# Patient Record
Sex: Female | Born: 1955 | Race: Black or African American | Hispanic: No | Marital: Single | Smoking: Never smoker
Health system: Southern US, Community
[De-identification: ages and names within clinical notes are randomized; demographics above are authoritative.]

## PROBLEM LIST (undated history)

## (undated) DIAGNOSIS — E785 Hyperlipidemia, unspecified: Secondary | ICD-10-CM

## (undated) DIAGNOSIS — H269 Unspecified cataract: Secondary | ICD-10-CM

## (undated) DIAGNOSIS — F329 Major depressive disorder, single episode, unspecified: Secondary | ICD-10-CM

## (undated) DIAGNOSIS — E119 Type 2 diabetes mellitus without complications: Secondary | ICD-10-CM

## (undated) DIAGNOSIS — I10 Essential (primary) hypertension: Secondary | ICD-10-CM

## (undated) DIAGNOSIS — J439 Emphysema, unspecified: Secondary | ICD-10-CM

## (undated) DIAGNOSIS — T7840XA Allergy, unspecified, initial encounter: Secondary | ICD-10-CM

## (undated) DIAGNOSIS — F32A Depression, unspecified: Secondary | ICD-10-CM

## (undated) DIAGNOSIS — K219 Gastro-esophageal reflux disease without esophagitis: Secondary | ICD-10-CM

## (undated) DIAGNOSIS — R011 Cardiac murmur, unspecified: Secondary | ICD-10-CM

## (undated) HISTORY — DX: Emphysema, unspecified: J43.9

## (undated) HISTORY — DX: Unspecified cataract: H26.9

## (undated) HISTORY — DX: Essential (primary) hypertension: I10

## (undated) HISTORY — DX: Major depressive disorder, single episode, unspecified: F32.9

## (undated) HISTORY — DX: Type 2 diabetes mellitus without complications: E11.9

## (undated) HISTORY — DX: Depression, unspecified: F32.A

## (undated) HISTORY — DX: Cardiac murmur, unspecified: R01.1

## (undated) HISTORY — PX: RADICAL HYSTERECTOMY: SHX2283

## (undated) HISTORY — PX: ABDOMINAL HYSTERECTOMY: SHX81

## (undated) HISTORY — DX: Hyperlipidemia, unspecified: E78.5

## (undated) HISTORY — DX: Gastro-esophageal reflux disease without esophagitis: K21.9

## (undated) HISTORY — DX: Allergy, unspecified, initial encounter: T78.40XA

---

## 2006-08-25 ENCOUNTER — Encounter: Admission: RE | Admit: 2006-08-25 | Discharge: 2006-08-25 | Payer: Self-pay | Admitting: *Deleted

## 2012-01-19 ENCOUNTER — Other Ambulatory Visit: Payer: Self-pay | Admitting: *Deleted

## 2012-01-19 ENCOUNTER — Ambulatory Visit
Admission: RE | Admit: 2012-01-19 | Discharge: 2012-01-19 | Disposition: A | Discharge status: Home / Self Care | Payer: PRIVATE HEALTH INSURANCE | Source: Ambulatory Visit | Attending: *Deleted | Admitting: *Deleted

## 2012-01-19 DIAGNOSIS — Z1231 Encounter for screening mammogram for malignant neoplasm of breast: Secondary | ICD-10-CM

## 2012-12-31 ENCOUNTER — Other Ambulatory Visit: Payer: Self-pay

## 2012-12-31 DIAGNOSIS — Z1231 Encounter for screening mammogram for malignant neoplasm of breast: Secondary | ICD-10-CM

## 2013-01-31 ENCOUNTER — Ambulatory Visit
Admission: RE | Admit: 2013-01-31 | Discharge: 2013-01-31 | Disposition: A | Discharge status: Home / Self Care | Payer: PRIVATE HEALTH INSURANCE | Source: Ambulatory Visit

## 2013-01-31 DIAGNOSIS — Z1231 Encounter for screening mammogram for malignant neoplasm of breast: Secondary | ICD-10-CM

## 2013-12-27 ENCOUNTER — Other Ambulatory Visit: Payer: Self-pay

## 2013-12-27 DIAGNOSIS — Z1231 Encounter for screening mammogram for malignant neoplasm of breast: Secondary | ICD-10-CM

## 2014-02-01 ENCOUNTER — Other Ambulatory Visit: Payer: Self-pay

## 2014-02-01 ENCOUNTER — Ambulatory Visit
Admission: RE | Admit: 2014-02-01 | Discharge: 2014-02-01 | Disposition: A | Discharge status: Home / Self Care | Payer: PRIVATE HEALTH INSURANCE | Source: Ambulatory Visit

## 2014-02-01 ENCOUNTER — Ambulatory Visit: Payer: PRIVATE HEALTH INSURANCE

## 2014-02-01 DIAGNOSIS — Z1231 Encounter for screening mammogram for malignant neoplasm of breast: Secondary | ICD-10-CM

## 2015-02-27 ENCOUNTER — Other Ambulatory Visit: Payer: Self-pay

## 2015-02-27 DIAGNOSIS — Z1231 Encounter for screening mammogram for malignant neoplasm of breast: Secondary | ICD-10-CM

## 2015-04-09 ENCOUNTER — Ambulatory Visit
Admission: RE | Admit: 2015-04-09 | Discharge: 2015-04-09 | Disposition: A | Discharge status: Home / Self Care | Payer: Medicare Other | Source: Ambulatory Visit

## 2015-04-09 DIAGNOSIS — Z1231 Encounter for screening mammogram for malignant neoplasm of breast: Secondary | ICD-10-CM

## 2017-01-28 ENCOUNTER — Other Ambulatory Visit: Payer: Self-pay | Admitting: Physician Assistant

## 2017-02-19 ENCOUNTER — Ambulatory Visit (INDEPENDENT_AMBULATORY_CARE_PROVIDER_SITE_OTHER): Payer: Medicare Other | Admitting: Pediatrics

## 2017-02-19 ENCOUNTER — Encounter: Payer: Self-pay | Admitting: Pediatrics

## 2017-02-19 VITALS — BP 130/71 | HR 84 | Temp 98.3°F | Ht <= 58 in | Wt 143.2 lb

## 2017-02-19 DIAGNOSIS — R6250 Unspecified lack of expected normal physiological development in childhood: Secondary | ICD-10-CM

## 2017-02-19 DIAGNOSIS — Z1211 Encounter for screening for malignant neoplasm of colon: Secondary | ICD-10-CM | POA: Diagnosis not present

## 2017-02-19 DIAGNOSIS — E119 Type 2 diabetes mellitus without complications: Secondary | ICD-10-CM | POA: Diagnosis not present

## 2017-02-19 DIAGNOSIS — Z1239 Encounter for other screening for malignant neoplasm of breast: Secondary | ICD-10-CM

## 2017-02-19 DIAGNOSIS — E039 Hypothyroidism, unspecified: Secondary | ICD-10-CM

## 2017-02-19 DIAGNOSIS — Z1231 Encounter for screening mammogram for malignant neoplasm of breast: Secondary | ICD-10-CM | POA: Diagnosis not present

## 2017-02-19 DIAGNOSIS — E785 Hyperlipidemia, unspecified: Secondary | ICD-10-CM

## 2017-02-19 DIAGNOSIS — F840 Autistic disorder: Secondary | ICD-10-CM

## 2017-02-19 LAB — BAYER DCA HB A1C WAIVED: HB A1C: 6.9 % (ref <7.0)

## 2017-02-19 MED ORDER — PRAVASTATIN SODIUM 40 MG PO TABS
40.0000 mg | ORAL_TABLET | Freq: Every day | ORAL | 3 refills | Status: DC
Start: 2017-02-19 — End: 2017-12-08

## 2017-02-19 NOTE — Progress Notes (Signed)
Subjective:   Patient ID: Shelley Wood, female    DOB: 30-Sep-1955, 61 y.o.   MRN: [id number] CC: New Patient (Initial Visit) med prob f/u HPI: Shelley Wood is a 61 y.o. female presenting for New Patient (Initial Visit)  Previoulsy followed by [facility] Here today with cousin Shelley Wood describes her as special needs, has autism, dev delay Likes schedules Family does her pill box each week Lives with her sister Shelley Wood, sister is her guardian  Is afraid of water, has help getting in and out of tub Needs help with buttons, zippers, certain shirts Sometimes incontinent, not often Doesn't like loud noises, sometimes that's when she has incontinence Can make sandwiches, uses microwave Doesn't wander Someone with her 24h a day  Has an imaginary "BF" Shelley Wood who no one else can see, has been around forever per cousin, sometimes pt will yell at [name] when she is upset but never violent Sometimes cousin thinks she uses Shelley Wood to help communicate with her family, and outlet when she cant get her way Never sees anything  Cousin has no concerns for her safety  Was followed by psychiatry in the past to get disability  HLD: taking simvastatin every other day  Diabetes: diagnosed several years ago BGLs 101-up to 250  No h/o MI or CVA  Had a hysterectomy for vaginal bleeding several years ago Due for mammogram  Did FOBT last year   Past Medical History:  Diagnosis Date  . Allergy   . Cataract   . Depression   . Diabetes mellitus without complication ([place])   . Emphysema of lung ([place])   . GERD (gastroesophageal reflux disease)   . Heart murmur   . Hyperlipidemia   . Hypertension    Family History  Problem Relation Age of Onset  . Arthritis Mother   . Heart disease Mother   . Hypertension Mother   . Stroke Mother   . Alcohol abuse Father   . Arthritis Father   . Early death Father   . Heart disease Father   . Arthritis Sister   . Birth defects  Sister   . Cancer Sister   . COPD Sister   . Hyperlipidemia Sister   . Hypertension Sister   . Arthritis Brother   . Hyperlipidemia Brother   . Hypertension Brother   . Arthritis Maternal Grandmother   . Arthritis Maternal Grandfather   . Arthritis Brother   . Arthritis Sister   . Cancer Sister   . Arthritis Sister   . Learning disabilities Sister   . Mental retardation Sister   . Arthritis Sister   . Arthritis Sister    Social History   Social History  . Marital status: Single    Spouse name: N/A  . Number of children: N/A  . Years of education: N/A   Social History Main Topics  . Smoking status: Never Smoker  . Smokeless tobacco: Never Used  . Alcohol use No  . Drug use: No  . Sexual activity: No   Other Topics Concern  . None   Social History Narrative  . None   ROS: All systems negative other than what is in HPI  Objective:    BP 130/71   Pulse 84   Temp 98.3 F (36.8 C) (Oral)   Ht '4\' 7"'$  (1.397 m)   Wt 143 lb 3.2 oz (65 kg)   BMI 33.28 kg/m   Wt Readings from Last 3 Encounters:  02/19/17 143 lb 3.2 oz (65  kg)    Gen: NAD, alert, cooperative with exam, NCAT EYES: EOMI, no conjunctival injection, or no icterus ENT:  TMs pearly gray b/l, OP without erythema LYMPH: no cervical LAD CV: NRRR, normal S1/S2, no murmur, distal pulses 2+ b/l Resp: CTABL, no wheezes, normal WOB Abd: +BS, soft, NTND. no guarding or organomegaly Ext: No edema, warm Neuro: Alert and oriented to self, moves all 4 legs equally MSK: normal muscle bulk  Assessment & Plan:  Shelley Wood was seen today for new patient (initial visit).  Diagnoses and all orders for this visit:  Hyperlipidemia, unspecified hyperlipidemia type switch to pravastatin, some sx with simvastatin, check labs -     pravastatin (PRAVACHOL) 40 MG tablet; Take 1 tablet (40 mg total) by mouth daily. -     Lipid panel  Hypothyroidism, unspecified type -     TSH  Screen for colon cancer Due for  screening -     Fecal occult blood, imunochemical; Future  Screening for breast cancer Due for screening -     MM Digital Screening; Future  Type 2 diabetes mellitus without complication, without long-term current use of insulin (HCC) A1c 6.9 today, adequate contorl, cont current PO meds -     CMP14+EGFR -     Bayer DCA Hb A1c Waived  Follow up plan: Return in about 3 months (around 05/22/2017) for med follow up. [name], MD [place]

## 2017-02-20 LAB — CMP14+EGFR
ALBUMIN: 4.6 g/dL (ref 3.5–5.5)
ALT: 17 IU/L (ref 0–32)
AST: 14 IU/L (ref 0–40)
Albumin/Globulin Ratio: 1.6 (ref 1.2–2.2)
Alkaline Phosphatase: 66 IU/L (ref 39–117)
BUN / CREAT RATIO: 15 (ref 9–23)
BUN: 11 mg/dL (ref 6–24)
Bilirubin Total: <0.2 mg/dL (ref 0.0–1.2)
CALCIUM: 10.8 mg/dL — AB (ref 8.7–10.2)
CO2: 24 mmol/L (ref 20–29)
CREATININE: 0.73 mg/dL (ref 0.57–1.00)
Chloride: 99 mmol/L (ref 96–106)
GFR, EST AFRICAN AMERICAN: 104 mL/min/{1.73_m2} (ref >59)
GFR, EST NON AFRICAN AMERICAN: 90 mL/min/{1.73_m2} (ref >59)
GLOBULIN, TOTAL: 2.9 g/dL (ref 1.5–4.5)
Glucose: 118 mg/dL — ABNORMAL HIGH (ref 65–99)
Potassium: 4.9 mmol/L (ref 3.5–5.2)
SODIUM: 140 mmol/L (ref 134–144)
TOTAL PROTEIN: 7.5 g/dL (ref 6.0–8.5)

## 2017-02-20 LAB — LIPID PANEL
CHOL/HDL RATIO: 3.4 ratio (ref 0.0–4.4)
Cholesterol, Total: 146 mg/dL (ref 100–199)
HDL: 43 mg/dL (ref >39)
LDL CALC: 88 mg/dL (ref 0–99)
Triglycerides: 75 mg/dL (ref 0–149)
VLDL CHOLESTEROL CAL: 15 mg/dL (ref 5–40)

## 2017-02-20 LAB — TSH: TSH: 1.86 u[IU]/mL (ref 0.450–4.500)

## 2017-02-21 DIAGNOSIS — E119 Type 2 diabetes mellitus without complications: Secondary | ICD-10-CM | POA: Insufficient documentation

## 2017-02-21 DIAGNOSIS — Z1239 Encounter for other screening for malignant neoplasm of breast: Secondary | ICD-10-CM | POA: Insufficient documentation

## 2017-02-21 DIAGNOSIS — Z1211 Encounter for screening for malignant neoplasm of colon: Secondary | ICD-10-CM | POA: Insufficient documentation

## 2017-02-21 DIAGNOSIS — E785 Hyperlipidemia, unspecified: Secondary | ICD-10-CM | POA: Insufficient documentation

## 2017-02-21 DIAGNOSIS — F840 Autistic disorder: Secondary | ICD-10-CM | POA: Insufficient documentation

## 2017-02-21 DIAGNOSIS — R6250 Unspecified lack of expected normal physiological development in childhood: Secondary | ICD-10-CM | POA: Insufficient documentation

## 2017-03-09 ENCOUNTER — Ambulatory Visit: Payer: Medicare Other

## 2017-05-25 ENCOUNTER — Ambulatory Visit: Payer: Medicare Other | Admitting: Pediatrics

## 2017-05-27 ENCOUNTER — Encounter: Payer: Self-pay | Admitting: Pediatrics

## 2017-06-09 ENCOUNTER — Ambulatory Visit (INDEPENDENT_AMBULATORY_CARE_PROVIDER_SITE_OTHER): Payer: Medicare Other | Admitting: Physician Assistant

## 2017-06-09 ENCOUNTER — Encounter: Payer: Self-pay | Admitting: Physician Assistant

## 2017-06-09 VITALS — BP 137/74 | HR 84 | Temp 97.7°F | Ht <= 58 in | Wt 144.6 lb

## 2017-06-09 DIAGNOSIS — Z23 Encounter for immunization: Secondary | ICD-10-CM | POA: Diagnosis not present

## 2017-06-09 DIAGNOSIS — E119 Type 2 diabetes mellitus without complications: Secondary | ICD-10-CM

## 2017-06-09 DIAGNOSIS — E1159 Type 2 diabetes mellitus with other circulatory complications: Secondary | ICD-10-CM

## 2017-06-09 DIAGNOSIS — E1169 Type 2 diabetes mellitus with other specified complication: Secondary | ICD-10-CM | POA: Diagnosis not present

## 2017-06-09 DIAGNOSIS — I10 Essential (primary) hypertension: Secondary | ICD-10-CM | POA: Diagnosis not present

## 2017-06-09 DIAGNOSIS — E785 Hyperlipidemia, unspecified: Secondary | ICD-10-CM

## 2017-06-09 LAB — BAYER DCA HB A1C WAIVED: HB A1C (BAYER DCA - WAIVED): 6.9 % (ref <7.0)

## 2017-06-09 MED ORDER — OMEPRAZOLE 20 MG PO CPDR: 20.0000 mg | DELAYED_RELEASE_CAPSULE | Freq: Every day | ORAL | 3 refills | Status: DC

## 2017-06-09 MED ORDER — JANUVIA 100 MG PO TABS: 100.0000 mg | ORAL_TABLET | Freq: Every day | ORAL | 3 refills | Status: DC

## 2017-06-09 MED ORDER — METFORMIN HCL 500 MG PO TABS: 500.0000 mg | ORAL_TABLET | Freq: Every day | ORAL | 3 refills | Status: DC

## 2017-06-09 MED ORDER — OLMESARTAN MEDOXOMIL 40 MG PO TABS: 40.0000 mg | ORAL_TABLET | Freq: Every day | ORAL | 3 refills | Status: DC

## 2017-06-09 NOTE — Patient Instructions (Signed)
In a few days you may receive a survey in the mail or online from Press Ganey regarding your visit with us today. Please take a moment to fill this out. Your feedback is very important to our whole office. It can help us better understand your needs as well as improve your experience and satisfaction. Thank you for taking your time to complete it. We care about you.  Riad Wagley, PA-C  

## 2017-06-09 NOTE — Progress Notes (Signed)
BP 137/74   Pulse 84   Temp 97.7 F (36.5 C) (Oral)   Ht _0  (1.397 m)   Wt 144 lb 9.6 oz (65.6 kg)   BMI 33.61 kg/m    Subjective:    Patient ID: Shelley Wood, female    DOB: 1955-11-21, 61 y.o.   MRN: [id number]  HPI: Shelley Wood is a 61 y.o. female presenting on 06/09/2017 for Follow-up (3 month-switching from [place])    Relevant past medical, surgical, family and social history reviewed and updated as indicated. Allergies and medications reviewed and updated.  Past Medical History:  Diagnosis Date  . Allergy   . Cataract   . Depression   . Diabetes mellitus without complication ([place])   . Emphysema of lung ([place])   . GERD (gastroesophageal reflux disease)   . Heart murmur   . Hyperlipidemia   . Hypertension     History reviewed. No pertinent surgical history.  Review of Systems  Constitutional: Negative.  Negative for activity change, fatigue and fever.  HENT: Negative.   Eyes: Negative.   Respiratory: Negative.  Negative for cough.   Cardiovascular: Negative.  Negative for chest pain.  Gastrointestinal: Negative.  Negative for abdominal pain.  Endocrine: Negative.   Genitourinary: Negative.  Negative for dysuria.  Musculoskeletal: Negative.   Skin: Negative.   Neurological: Negative.     Allergies as of 06/09/2017   No Known Allergies     Medication List        Accurate as of 06/09/17 10:28 PM. Always use your most recent med list.          JANUVIA 100 MG tablet Generic drug:  sitaGLIPtin Take 1 tablet (100 mg total) by mouth daily.   metFORMIN 500 MG tablet Commonly known as:  GLUCOPHAGE Take 1 tablet (500 mg total) by mouth daily with breakfast.   olmesartan 40 MG tablet Commonly known as:  BENICAR Take 1 tablet (40 mg total) by mouth daily.   omeprazole 20 MG capsule Commonly known as:  PRILOSEC Take 1 capsule (20 mg total) by mouth daily.   ONE TOUCH ULTRA 2 w/Device Kit   ONE TOUCH ULTRA TEST test strip Generic drug:   glucose blood   ONETOUCH DELICA LANCETS 33A Misc   pravastatin 40 MG tablet Commonly known as:  PRAVACHOL Take 1 tablet (40 mg total) by mouth daily.          Objective:    BP 137/74   Pulse 84   Temp 97.7 F (36.5 C) (Oral)   Ht _1  (1.397 m)   Wt 144 lb 9.6 oz (65.6 kg)   BMI 33.61 kg/m   No Known Allergies  Physical Exam  Constitutional: She is oriented to person, place, and time. She appears well-developed and well-nourished.  HENT:  Head: Normocephalic and atraumatic.  Eyes: Conjunctivae and EOM are normal. Pupils are equal, round, and reactive to light.  Cardiovascular: Normal rate, regular rhythm, normal heart sounds and intact distal pulses.  Pulmonary/Chest: Effort normal and breath sounds normal.  Abdominal: Soft. Bowel sounds are normal.  Neurological: She is alert and oriented to person, place, and time. She has normal reflexes.  Skin: Skin is warm and dry. No rash noted.  Psychiatric: She has a normal mood and affect. Her behavior is normal. Judgment and thought content normal.    Results for orders placed or performed in visit on 06/09/17  Bayer DCA Hb A1c Waived  Result Value Ref Range   Bayer [facility]  Hb A1c Waived 6.9 <7.0 %      Assessment & Plan:   1. Type 2 diabetes mellitus without complication, without long-term current use of insulin (HCC) - metFORMIN (GLUCOPHAGE) 500 MG tablet; Take 1 tablet (500 mg total) by mouth daily with breakfast.  Dispense: 90 tablet; Refill: 3 - JANUVIA 100 MG tablet; Take 1 tablet (100 mg total) by mouth daily.  Dispense: 90 tablet; Refill: 3 - Bayer DCA Hb A1c Waived  2. Hypertension associated with diabetes ([place]) - olmesartan (BENICAR) 40 MG tablet; Take 1 tablet (40 mg total) by mouth daily.  Dispense: 90 tablet; Refill: 3  3. Hyperlipidemia associated with type 2 diabetes mellitus ([place])  4. Hyperlipidemia, unspecified hyperlipidemia type  5. Need for immunization against influenza - Flu Vaccine QUAD 36+ mos  IM    Current Outpatient Medications:  .  Blood Glucose Monitoring Suppl (ONE TOUCH ULTRA 2) w/Device KIT, , Disp: , Rfl:  .  JANUVIA 100 MG tablet, Take 1 tablet (100 mg total) by mouth daily., Disp: 90 tablet, Rfl: 3 .  metFORMIN (GLUCOPHAGE) 500 MG tablet, Take 1 tablet (500 mg total) by mouth daily with breakfast., Disp: 90 tablet, Rfl: 3 .  olmesartan (BENICAR) 40 MG tablet, Take 1 tablet (40 mg total) by mouth daily., Disp: 90 tablet, Rfl: 3 .  omeprazole (PRILOSEC) 20 MG capsule, Take 1 capsule (20 mg total) by mouth daily., Disp: 90 capsule, Rfl: 3 .  ONE TOUCH ULTRA TEST test strip, , Disp: , Rfl:  .  ONETOUCH DELICA LANCETS 82M MISC, , Disp: , Rfl:  .  pravastatin (PRAVACHOL) 40 MG tablet, Take 1 tablet (40 mg total) by mouth daily., Disp: 90 tablet, Rfl: 3 Continue all other maintenance medications as listed above.  Follow up plan: Return in about 6 months (around 12/07/2017) for recheck.  Educational handout given for [place] PA-C [place] [address]  [place], [place] [address] [phone]   06/09/2017, 10:28 PM

## 2017-08-11 LAB — HM MAMMOGRAPHY

## 2017-12-08 ENCOUNTER — Encounter: Payer: Self-pay | Admitting: Physician Assistant

## 2017-12-08 ENCOUNTER — Ambulatory Visit (INDEPENDENT_AMBULATORY_CARE_PROVIDER_SITE_OTHER): Payer: Medicare Other | Admitting: Physician Assistant

## 2017-12-08 VITALS — BP 182/86 | HR 92 | Temp 97.6°F | Ht <= 58 in | Wt 153.0 lb

## 2017-12-08 DIAGNOSIS — I10 Essential (primary) hypertension: Secondary | ICD-10-CM

## 2017-12-08 DIAGNOSIS — E785 Hyperlipidemia, unspecified: Secondary | ICD-10-CM | POA: Diagnosis not present

## 2017-12-08 DIAGNOSIS — E1159 Type 2 diabetes mellitus with other circulatory complications: Secondary | ICD-10-CM | POA: Diagnosis not present

## 2017-12-08 DIAGNOSIS — E119 Type 2 diabetes mellitus without complications: Secondary | ICD-10-CM

## 2017-12-08 LAB — BAYER DCA HB A1C WAIVED: HB A1C (BAYER DCA - WAIVED): 7.7 % — ABNORMAL HIGH (ref <7.0)

## 2017-12-08 MED ORDER — PRAVASTATIN SODIUM 40 MG PO TABS: 40.0000 mg | ORAL_TABLET | Freq: Every day | ORAL | 3 refills | Status: DC

## 2017-12-08 NOTE — Progress Notes (Signed)
BP (!) 182/86   Pulse 92   Temp 97.6 F (36.4 C)   Ht '4\' 7"'$  (1.397 m)   Wt 153 lb (69.4 kg)   BMI 35.56 kg/m    Subjective:    Patient ID: Shelley Wood, female    DOB: Nov 27, 1955, 62 y.o.   MRN: [id number]  HPI: Kabria Hetzer is a 62 y.o. female presenting on 12/08/2017 for Follow-up  This patient comes in for a 62-monthrecheck on her medical conditions which are positive for diabetes, hypertension, GERD, hyperlipidemia.  She is taking all of her medications regularly.  She denies any difficulties with her medication.  She is due labs today.  There are no other new complaints at this time.  Past Medical History:  Diagnosis Date  . Allergy   . Cataract   . Depression   . Diabetes mellitus without complication ([place]   . Emphysema of lung ([place]   . GERD (gastroesophageal reflux disease)   . Heart murmur   . Hyperlipidemia   . Hypertension    Relevant past medical, surgical, family and social history reviewed and updated as indicated. Interim medical history since our last visit reviewed. Allergies and medications reviewed and updated. DATA REVIEWED: CHART IN EPIC  Family History reviewed for pertinent findings.  Review of Systems  Constitutional: Negative.  Negative for activity change, fatigue and fever.  HENT: Negative.   Eyes: Negative.   Respiratory: Negative.  Negative for cough.   Cardiovascular: Negative.  Negative for chest pain.  Gastrointestinal: Negative.  Negative for abdominal pain.  Endocrine: Negative.   Genitourinary: Negative.  Negative for dysuria.  Musculoskeletal: Negative.   Skin: Negative.   Neurological: Negative.     Allergies as of 12/08/2017   No Known Allergies     Medication List        Accurate as of 12/08/17  2:12 PM. Always use your most recent med list.          JANUVIA 100 MG tablet Generic drug:  sitaGLIPtin Take 1 tablet (100 mg total) by mouth daily.   metFORMIN 500 MG tablet Commonly known as:  GLUCOPHAGE Take 1  tablet (500 mg total) by mouth daily with breakfast.   olmesartan 40 MG tablet Commonly known as:  BENICAR Take 1 tablet (40 mg total) by mouth daily.   omeprazole 20 MG capsule Commonly known as:  PRILOSEC Take 1 capsule (20 mg total) by mouth daily.   ONE TOUCH ULTRA 2 w/Device Kit   ONE TOUCH ULTRA TEST test strip Generic drug:  glucose blood   ONETOUCH DELICA LANCETS 322QMisc   pravastatin 40 MG tablet Commonly known as:  PRAVACHOL Take 1 tablet (40 mg total) by mouth daily.          Objective:    BP (!) 182/86   Pulse 92   Temp 97.6 F (36.4 C)   Ht '4\' 7"'$  (1.397 m)   Wt 153 lb (69.4 kg)   BMI 35.56 kg/m   No Known Allergies  Wt Readings from Last 3 Encounters:  12/08/17 153 lb (69.4 kg)  06/09/17 144 lb 9.6 oz (65.6 kg)  02/19/17 143 lb 3.2 oz (65 kg)    Physical Exam  Constitutional: She is oriented to person, place, and time. She appears well-developed and well-nourished.  HENT:  Head: Normocephalic and atraumatic.  Right Ear: Tympanic membrane, external ear and ear canal normal.  Left Ear: Tympanic membrane, external ear and ear canal normal.  Nose: Nose  normal. No rhinorrhea.  Mouth/Throat: Oropharynx is clear and moist and mucous membranes are normal. No oropharyngeal exudate or posterior oropharyngeal erythema.  Eyes: Pupils are equal, round, and reactive to light. Conjunctivae and EOM are normal.  Neck: Normal range of motion. Neck supple.  Cardiovascular: Normal rate, regular rhythm, normal heart sounds and intact distal pulses.  Pulmonary/Chest: Effort normal and breath sounds normal.  Abdominal: Soft. Bowel sounds are normal.  Neurological: She is alert and oriented to person, place, and time. She has normal reflexes.  Skin: Skin is warm and dry. No rash noted.  Psychiatric: She has a normal mood and affect. Her behavior is normal. Judgment and thought content normal.    Results for orders placed or performed in visit on 12/08/17  Bayer DCA  Hb A1c Waived  Result Value Ref Range   HB A1C (BAYER DCA - WAIVED) 7.7 (H) <7.0 %      Assessment & Plan:   1. Hyperlipidemia, unspecified hyperlipidemia type - CBC with Differential/Platelet - CMP14+EGFR - Lipid panel - pravastatin (PRAVACHOL) 40 MG tablet; Take 1 tablet (40 mg total) by mouth daily.  Dispense: 90 tablet; Refill: 3  2. Type 2 diabetes mellitus without complication, without long-term current use of insulin (HCC) - CBC with Differential/Platelet - CMP14+EGFR - TSH - Bayer DCA Hb A1c Waived  3. Hypertension associated with diabetes ([place]) - TSH   Continue all other maintenance medications as listed above.  Follow up plan: Return in about 6 months (around 06/10/2018) for recheck.  Educational handout given for [place] PA-C [place] [address]  [place], [place] [address] [phone]   12/08/2017, 2:12 PM

## 2017-12-08 NOTE — Patient Instructions (Addendum)
In a few days you may receive a survey in the mail or online from [employer] Ganey regarding your visit with us today. Please take a moment to fill this out. Your feedback is very important to our whole office. It can help us better understand your needs as well as improve your experience and satisfaction. Thank you for taking your time to complete it. We care about you.  Garik Diamant, PA-C  

## 2017-12-09 LAB — CBC WITH DIFFERENTIAL/PLATELET
BASOS: 1 %
Basophils Absolute: 0 10*3/uL (ref 0.0–0.2)
EOS (ABSOLUTE): 0.1 10*3/uL (ref 0.0–0.4)
EOS: 1 %
HEMATOCRIT: 37.6 % (ref 34.0–46.6)
HEMOGLOBIN: 12.1 g/dL (ref 11.1–15.9)
Immature Grans (Abs): 0 10*3/uL (ref 0.0–0.1)
Immature Granulocytes: 0 %
LYMPHS ABS: 1.7 10*3/uL (ref 0.7–3.1)
Lymphs: 29 %
MCH: 27.4 pg (ref 26.6–33.0)
MCHC: 32.2 g/dL (ref 31.5–35.7)
MCV: 85 fL (ref 79–97)
MONOS ABS: 0.4 10*3/uL (ref 0.1–0.9)
Monocytes: 6 %
NEUTROS ABS: 3.6 10*3/uL (ref 1.4–7.0)
Neutrophils: 63 %
Platelets: 370 10*3/uL (ref 150–450)
RBC: 4.42 x10E6/uL (ref 3.77–5.28)
RDW: 15.6 % — ABNORMAL HIGH (ref 12.3–15.4)
WBC: 5.8 10*3/uL (ref 3.4–10.8)

## 2017-12-09 LAB — LIPID PANEL
Chol/HDL Ratio: 3.9 ratio (ref 0.0–4.4)
Cholesterol, Total: 148 mg/dL (ref 100–199)
HDL: 38 mg/dL — ABNORMAL LOW (ref >39)
LDL CALC: 86 mg/dL (ref 0–99)
TRIGLYCERIDES: 119 mg/dL (ref 0–149)
VLDL Cholesterol Cal: 24 mg/dL (ref 5–40)

## 2017-12-09 LAB — CMP14+EGFR
A/G RATIO: 1.6 (ref 1.2–2.2)
ALBUMIN: 4.4 g/dL (ref 3.5–5.5)
ALT: 34 IU/L — ABNORMAL HIGH (ref 0–32)
AST: 14 IU/L (ref 0–40)
Alkaline Phosphatase: 72 IU/L (ref 39–117)
BUN / CREAT RATIO: 12 (ref 9–23)
BUN: 9 mg/dL (ref 6–24)
Bilirubin Total: 0.2 mg/dL (ref 0.0–1.2)
CO2: 23 mmol/L (ref 20–29)
Calcium: 10.2 mg/dL (ref 8.7–10.2)
Chloride: 103 mmol/L (ref 96–106)
Creatinine, Ser: 0.78 mg/dL (ref 0.57–1.00)
GFR calc Af Amer: 96 mL/min/{1.73_m2} (ref >59)
GFR calc non Af Amer: 83 mL/min/{1.73_m2} (ref >59)
GLOBULIN, TOTAL: 2.8 g/dL (ref 1.5–4.5)
Glucose: 154 mg/dL — ABNORMAL HIGH (ref 65–99)
POTASSIUM: 4.6 mmol/L (ref 3.5–5.2)
SODIUM: 143 mmol/L (ref 134–144)
Total Protein: 7.2 g/dL (ref 6.0–8.5)

## 2017-12-09 LAB — TSH: TSH: 1.45 u[IU]/mL (ref 0.450–4.500)

## 2017-12-10 ENCOUNTER — Other Ambulatory Visit: Payer: Self-pay | Admitting: Physician Assistant

## 2017-12-10 MED ORDER — EMPAGLIFLOZIN 10 MG PO TABS: 10.0000 mg | ORAL_TABLET | Freq: Every day | ORAL | 2 refills | Status: DC

## 2018-03-03 ENCOUNTER — Other Ambulatory Visit: Payer: Self-pay | Admitting: Physician Assistant

## 2018-03-04 NOTE — Telephone Encounter (Signed)
Ov 12/08/17 RTC 6 mos

## 2018-03-16 NOTE — Progress Notes (Unsigned)
[facility]  

## 2018-06-03 ENCOUNTER — Other Ambulatory Visit: Payer: Self-pay | Admitting: Physician Assistant

## 2018-06-03 NOTE — Telephone Encounter (Signed)
OV 06/15/18

## 2018-06-09 ENCOUNTER — Ambulatory Visit: Payer: Medicare Other | Admitting: Physician Assistant

## 2018-06-15 ENCOUNTER — Encounter: Payer: Self-pay | Admitting: Physician Assistant

## 2018-06-15 ENCOUNTER — Ambulatory Visit (INDEPENDENT_AMBULATORY_CARE_PROVIDER_SITE_OTHER): Payer: Medicare Other | Admitting: Physician Assistant

## 2018-06-15 VITALS — BP 139/76 | HR 95 | Temp 97.0°F | Ht <= 58 in | Wt 144.2 lb

## 2018-06-15 DIAGNOSIS — I10 Essential (primary) hypertension: Secondary | ICD-10-CM

## 2018-06-15 DIAGNOSIS — Z23 Encounter for immunization: Secondary | ICD-10-CM | POA: Diagnosis not present

## 2018-06-15 DIAGNOSIS — Z1211 Encounter for screening for malignant neoplasm of colon: Secondary | ICD-10-CM

## 2018-06-15 DIAGNOSIS — E119 Type 2 diabetes mellitus without complications: Secondary | ICD-10-CM

## 2018-06-15 DIAGNOSIS — E7849 Other hyperlipidemia: Secondary | ICD-10-CM | POA: Diagnosis not present

## 2018-06-15 DIAGNOSIS — E785 Hyperlipidemia, unspecified: Secondary | ICD-10-CM

## 2018-06-15 DIAGNOSIS — E1159 Type 2 diabetes mellitus with other circulatory complications: Secondary | ICD-10-CM | POA: Diagnosis not present

## 2018-06-15 LAB — BAYER DCA HB A1C WAIVED: HB A1C (BAYER DCA - WAIVED): 7.4 % — ABNORMAL HIGH (ref <7.0)

## 2018-06-15 MED ORDER — METFORMIN HCL 500 MG PO TABS: 500.0000 mg | ORAL_TABLET | Freq: Every day | ORAL | 3 refills | Status: DC

## 2018-06-15 MED ORDER — EMPAGLIFLOZIN 10 MG PO TABS: 10.0000 mg | ORAL_TABLET | Freq: Every day | ORAL | 3 refills | Status: DC

## 2018-06-15 MED ORDER — PRAVASTATIN SODIUM 40 MG PO TABS: 40.0000 mg | ORAL_TABLET | Freq: Every day | ORAL | 3 refills | Status: DC

## 2018-06-15 MED ORDER — BETAMETHASONE DIPROPIONATE AUG 0.05 % EX CREA: TOPICAL_CREAM | Freq: Two times a day (BID) | CUTANEOUS | 2 refills | Status: DC

## 2018-06-15 MED ORDER — OMEPRAZOLE 20 MG PO CPDR: 20.0000 mg | DELAYED_RELEASE_CAPSULE | Freq: Every day | ORAL | 3 refills | Status: DC

## 2018-06-15 MED ORDER — OLMESARTAN MEDOXOMIL 40 MG PO TABS: 40.0000 mg | ORAL_TABLET | Freq: Every day | ORAL | 3 refills | Status: DC

## 2018-06-15 MED ORDER — JANUVIA 100 MG PO TABS: 100.0000 mg | ORAL_TABLET | Freq: Every day | ORAL | 3 refills | Status: DC

## 2018-06-15 NOTE — Patient Instructions (Signed)
   Your doctor has prescribed Cologuard, an easy-to-use, noninvasive test for colon cancer screening, based on the latest advances in stool DNA science.   Here's what will happen next:  1. You may receive a call or email from [facility] to confirm your mailing address and insurance information 2. Your kit will be shipped directly to you 3. You collect your stool sample in the privacy of your own home 4. You return the kit via [facility] shipping or pick-up, in the same box it arrived in 5. You doctor will contact you with the results once they are available  Screening for colon cancer is very important to your good health, so if you have any questions at all, please call [employer]'s Customer Support Specialists at [phone]. They are available 24 hours a day, 6 days a week.

## 2018-06-16 LAB — CBC WITH DIFFERENTIAL/PLATELET
BASOS ABS: 0 10*3/uL (ref 0.0–0.2)
Basos: 1 %
EOS (ABSOLUTE): 0.1 10*3/uL (ref 0.0–0.4)
EOS: 1 %
HEMATOCRIT: 40.3 % (ref 34.0–46.6)
HEMOGLOBIN: 13.2 g/dL (ref 11.1–15.9)
IMMATURE GRANULOCYTES: 0 %
Immature Grans (Abs): 0 10*3/uL (ref 0.0–0.1)
LYMPHS: 35 %
Lymphocytes Absolute: 2.4 10*3/uL (ref 0.7–3.1)
MCH: 27.2 pg (ref 26.6–33.0)
MCHC: 32.8 g/dL (ref 31.5–35.7)
MCV: 83 fL (ref 79–97)
MONOCYTES: 5 %
Monocytes Absolute: 0.3 10*3/uL (ref 0.1–0.9)
NEUTROS PCT: 58 %
Neutrophils Absolute: 4.1 10*3/uL (ref 1.4–7.0)
Platelets: 419 10*3/uL (ref 150–450)
RBC: 4.86 x10E6/uL (ref 3.77–5.28)
RDW: 14.7 % (ref 12.3–15.4)
WBC: 6.9 10*3/uL (ref 3.4–10.8)

## 2018-06-16 LAB — CMP14+EGFR
ALBUMIN: 4.5 g/dL (ref 3.6–4.8)
ALK PHOS: 87 IU/L (ref 39–117)
ALT: 20 IU/L (ref 0–32)
AST: 11 IU/L (ref 0–40)
Albumin/Globulin Ratio: 1.6 (ref 1.2–2.2)
BUN / CREAT RATIO: 13 (ref 12–28)
BUN: 11 mg/dL (ref 8–27)
Bilirubin Total: 0.3 mg/dL (ref 0.0–1.2)
CALCIUM: 10.1 mg/dL (ref 8.7–10.3)
CO2: 23 mmol/L (ref 20–29)
CREATININE: 0.85 mg/dL (ref 0.57–1.00)
Chloride: 101 mmol/L (ref 96–106)
GFR calc Af Amer: 86 mL/min/{1.73_m2} (ref >59)
GFR, EST NON AFRICAN AMERICAN: 75 mL/min/{1.73_m2} (ref >59)
GLOBULIN, TOTAL: 2.8 g/dL (ref 1.5–4.5)
GLUCOSE: 159 mg/dL — AB (ref 65–99)
Potassium: 4.5 mmol/L (ref 3.5–5.2)
SODIUM: 142 mmol/L (ref 134–144)
Total Protein: 7.3 g/dL (ref 6.0–8.5)

## 2018-06-16 LAB — LIPID PANEL
CHOL/HDL RATIO: 4.4 ratio (ref 0.0–4.4)
CHOLESTEROL TOTAL: 177 mg/dL (ref 100–199)
HDL: 40 mg/dL (ref >39)
LDL CALC: 111 mg/dL — AB (ref 0–99)
TRIGLYCERIDES: 129 mg/dL (ref 0–149)
VLDL CHOLESTEROL CAL: 26 mg/dL (ref 5–40)

## 2018-06-18 NOTE — Progress Notes (Signed)
BP 139/76   Pulse 95   Temp (!) 97 F (36.1 C) (Oral)   Ht '4\' 7"'$  (1.397 m)   Wt 144 lb 3.2 oz (65.4 kg)   BMI 33.52 kg/m    Subjective:    Patient ID: Shelley Wood, female    DOB: 31-Oct-1955, 62 y.o.   MRN: [id number]  HPI: Shelley Wood is a 62 y.o. female presenting on 06/15/2018 for Diabetes (6 month follow up ); Hypertension; Hyperlipidemia; and Eczema (needs cream)  This patient comes in for 14-monthfollow-up on her chronic medical conditions which do include diabetes, hypertension, hyperlipidemia.  Overall she has been doing well not having any difficulties.  All other medications are reviewed.  She does need to have labs updated.  There are no other complaints at this time.   Past Medical History:  Diagnosis Date  . Allergy   . Cataract   . Depression   . Diabetes mellitus without complication ([place]   . Emphysema of lung ([place]   . GERD (gastroesophageal reflux disease)   . Heart murmur   . Hyperlipidemia   . Hypertension    Relevant past medical, surgical, family and social history reviewed and updated as indicated. Interim medical history since our last visit reviewed. Allergies and medications reviewed and updated. DATA REVIEWED: CHART IN EPIC  Family History reviewed for pertinent findings.  Review of Systems  Constitutional: Negative.   HENT: Negative.   Eyes: Negative.   Respiratory: Negative.   Gastrointestinal: Negative.   Genitourinary: Negative.     Allergies as of 06/15/2018   No Known Allergies     Medication List        Accurate as of 06/15/18 11:59 PM. Always use your most recent med list.          augmented betamethasone dipropionate 0.05 % cream Commonly known as:  DIPROLENE-AF Apply topically 2 (two) times daily.   empagliflozin 10 MG Tabs tablet Commonly known as:  JARDIANCE Take 10 mg by mouth daily.   JANUVIA 100 MG tablet Generic drug:  sitaGLIPtin Take 1 tablet (100 mg total) by mouth daily.   metFORMIN 500 MG  tablet Commonly known as:  GLUCOPHAGE Take 1 tablet (500 mg total) by mouth daily with breakfast.   olmesartan 40 MG tablet Commonly known as:  BENICAR Take 1 tablet (40 mg total) by mouth daily.   omeprazole 20 MG capsule Commonly known as:  PRILOSEC Take 1 capsule (20 mg total) by mouth daily.   ONE TOUCH ULTRA 2 w/Device Kit   ONE TOUCH ULTRA TEST test strip Generic drug:  glucose blood   ONETOUCH DELICA LANCETS 393AMisc   pravastatin 40 MG tablet Commonly known as:  PRAVACHOL Take 1 tablet (40 mg total) by mouth daily.          Objective:    BP 139/76   Pulse 95   Temp (!) 97 F (36.1 C) (Oral)   Ht '4\' 7"'$  (1.397 m)   Wt 144 lb 3.2 oz (65.4 kg)   BMI 33.52 kg/m   No Known Allergies  Wt Readings from Last 3 Encounters:  06/15/18 144 lb 3.2 oz (65.4 kg)  12/08/17 153 lb (69.4 kg)  06/09/17 144 lb 9.6 oz (65.6 kg)    Physical Exam  Constitutional: She is oriented to person, place, and time. She appears well-developed and well-nourished.  HENT:  Head: Normocephalic and atraumatic.  Eyes: Pupils are equal, round, and reactive to light. Conjunctivae and EOM are normal.  Cardiovascular: Normal rate, regular rhythm, normal heart sounds and intact distal pulses.  Pulmonary/Chest: Effort normal and breath sounds normal.  Abdominal: Soft. Bowel sounds are normal.  Neurological: She is alert and oriented to person, place, and time. She has normal reflexes.  Skin: Skin is warm and dry. No rash noted.  Psychiatric: She has a normal mood and affect. Her behavior is normal. Judgment and thought content normal.    Results for orders placed or performed in visit on 06/15/18  CBC with Differential/Platelet  Result Value Ref Range   WBC 6.9 3.4 - 10.8 x10E3/uL   RBC 4.86 3.77 - 5.28 x10E6/uL   Hemoglobin 13.2 11.1 - 15.9 g/dL   Hematocrit 40.3 34.0 - 46.6 %   MCV 83 79 - 97 fL   MCH 27.2 26.6 - 33.0 pg   MCHC 32.8 31.5 - 35.7 g/dL   RDW 14.7 12.3 - 15.4 %    Platelets 419 150 - 450 x10E3/uL   Neutrophils 58 Not Estab. %   Lymphs 35 Not Estab. %   Monocytes 5 Not Estab. %   Eos 1 Not Estab. %   Basos 1 Not Estab. %   Neutrophils Absolute 4.1 1.4 - 7.0 x10E3/uL   Lymphocytes Absolute 2.4 0.7 - 3.1 x10E3/uL   Monocytes Absolute 0.3 0.1 - 0.9 x10E3/uL   EOS (ABSOLUTE) 0.1 0.0 - 0.4 x10E3/uL   Basophils Absolute 0.0 0.0 - 0.2 x10E3/uL   Immature Granulocytes 0 Not Estab. %   Immature Grans (Abs) 0.0 0.0 - 0.1 x10E3/uL  CMP14+EGFR  Result Value Ref Range   Glucose 159 (H) 65 - 99 mg/dL   BUN 11 8 - 27 mg/dL   Creatinine, Ser 0.85 0.57 - 1.00 mg/dL   GFR calc non Af Amer 75 >59 mL/min/1.73   GFR calc Af Amer 86 >59 mL/min/1.73   BUN/Creatinine Ratio 13 12 - 28   Sodium 142 134 - 144 mmol/L   Potassium 4.5 3.5 - 5.2 mmol/L   Chloride 101 96 - 106 mmol/L   CO2 23 20 - 29 mmol/L   Calcium 10.1 8.7 - 10.3 mg/dL   Total Protein 7.3 6.0 - 8.5 g/dL   Albumin 4.5 3.6 - 4.8 g/dL   Globulin, Total 2.8 1.5 - 4.5 g/dL   Albumin/Globulin Ratio 1.6 1.2 - 2.2   Bilirubin Total 0.3 0.0 - 1.2 mg/dL   Alkaline Phosphatase 87 39 - 117 IU/L   AST 11 0 - 40 IU/L   ALT 20 0 - 32 IU/L  Lipid panel  Result Value Ref Range   Cholesterol, Total 177 100 - 199 mg/dL   Triglycerides 129 0 - 149 mg/dL   HDL 40 >39 mg/dL   VLDL Cholesterol Cal 26 5 - 40 mg/dL   LDL Calculated 111 (H) 0 - 99 mg/dL   Chol/HDL Ratio 4.4 0.0 - 4.4 ratio  Bayer DCA Hb A1c Waived  Result Value Ref Range   HB A1C (BAYER DCA - WAIVED) 7.4 (H) <7.0 %      Assessment & Plan:   1. Type 2 diabetes mellitus without complication, without long-term current use of insulin (HCC) - CBC with Differential/Platelet - CMP14+EGFR - Lipid panel - Bayer DCA Hb A1c Waived - JANUVIA 100 MG tablet; Take 1 tablet (100 mg total) by mouth daily.  Dispense: 90 tablet; Refill: 3 - metFORMIN (GLUCOPHAGE) 500 MG tablet; Take 1 tablet (500 mg total) by mouth daily with breakfast.  Dispense: 90 tablet;  Refill: 3  2.  Hypertension associated with diabetes ([place]) - CBC with Differential/Platelet - CMP14+EGFR - Lipid panel - Bayer DCA Hb A1c Waived - olmesartan (BENICAR) 40 MG tablet; Take 1 tablet (40 mg total) by mouth daily.  Dispense: 90 tablet; Refill: 3  3. Hyperlipidemia, unspecified hyperlipidemia type - Lipid panel - pravastatin (PRAVACHOL) 40 MG tablet; Take 1 tablet (40 mg total) by mouth daily.  Dispense: 90 tablet; Refill: 3  4. Screening for colon cancer - Cologuard  5. Need for immunization against influenza - Flu Vaccine QUAD 36+ mos IM   Continue all other maintenance medications as listed above.  Follow up plan: Return in about 6 months (around 12/15/2018) for may make 30 minute skin appt soon.  Educational handout given for [place] PA-C [place] [address]  [place], [place] [address] [phone]   06/18/2018, 1:23 PM

## 2018-06-23 ENCOUNTER — Ambulatory Visit: Payer: Medicare Other

## 2018-06-24 ENCOUNTER — Encounter: Payer: Self-pay | Admitting: *Deleted

## 2018-06-29 ENCOUNTER — Encounter: Payer: Self-pay | Admitting: Physician Assistant

## 2018-07-20 ENCOUNTER — Ambulatory Visit (INDEPENDENT_AMBULATORY_CARE_PROVIDER_SITE_OTHER): Payer: Medicare Other | Admitting: Physician Assistant

## 2018-07-20 ENCOUNTER — Encounter: Payer: Self-pay | Admitting: Physician Assistant

## 2018-07-20 VITALS — BP 165/78 | HR 92 | Temp 97.2°F | Ht <= 58 in | Wt 146.0 lb

## 2018-07-20 DIAGNOSIS — L918 Other hypertrophic disorders of the skin: Secondary | ICD-10-CM

## 2018-07-20 NOTE — Patient Instructions (Signed)
Cryoablation, Care After  This sheet gives you information about how to care for yourself after your procedure. Your health care provider may also give you more specific instructions. If you have problems or questions, contact your health care provider.  What can I expect after the procedure?  After the procedure, it is common to have:   Soreness around the treatment area.   Mild pain and swelling in the treatment area.  Follow these instructions at home:  Treatment area care     Follow instructions from your health care provider about how to take care of your incision. Make sure you:  ? Wash your hands with soap and water before you change your bandage (dressing). If soap and water are not available, use hand sanitizer.  ? Change your dressing as told by your health care provider.  ? Leave stitches (sutures) in place. They may need to stay in place for 2 weeks or longer.   Check your treatment area every day for signs of infection. Check for:  ? More redness, swelling, or pain.  ? More fluid or blood.  ? Warmth.  ? Pus or a bad smell.   Keep the treated area clean, dry, and covered with a dressing until it has healed. Clean the area with soap and water or as told by your health care provider.   You may shower if your health care provider approves. If your bandage gets wet, change it right away.  Activity   Follow instructions from your health care provider about any activity limitations.   Do not drive for 24 hours if you received a medicine to help you relax (sedative).  General instructions   Take over-the-counter and prescription medicines only as told by your health care provider.   Keep all follow-up visits as told by your health care provider. This is important.  Contact a health care provider if:   You do not have a bowel movement for 2 days.   You have nausea or vomiting.   You have more redness, swelling, or pain around your treatment area.   You have more fluid or blood coming from your  treatment area.   Your treatment area feels warm to the touch.   You have pus or a bad smell coming from your treatment area.   You have a fever.  Get help right away if:   You have severe pain.   You have trouble swallowing or breathing.   You have severe weakness or dizziness.   You have chest pain or shortness of breath.  This information is not intended to replace advice given to you by your health care provider. Make sure you discuss any questions you have with your health care provider.  Document Released: 04/20/2013 Document Revised: 01/18/2016 Document Reviewed: 11/28/2015  Elsevier Interactive Patient Education  2019 [employer]

## 2018-07-22 NOTE — Progress Notes (Signed)
BP (!) 165/78   Pulse 92   Temp (!) 97.2 F (36.2 C) (Oral)   Ht _0  (1.397 m)   Wt 146 lb (66.2 kg)   BMI 33.93 kg/m    Subjective:    Patient ID: Shelley Wood, female    DOB: 1955-10-10, 63 y.o.   MRN: [id number]  HPI: Shelley Wood is a 63 y.o. female presenting on 07/20/2018 for mole removal  10 pendulous skin tags on neck, 4 on back an d6 on front, are treated with cryo therapy. They are treated with 2-3 free and thaw cycles each. Covered with bandages and wound care instructions are given.  Past Medical History:  Diagnosis Date  . Allergy   . Cataract   . Depression   . Diabetes mellitus without complication ([place])   . Emphysema of lung ([place])   . GERD (gastroesophageal reflux disease)   . Heart murmur   . Hyperlipidemia   . Hypertension    Relevant past medical, surgical, family and social history reviewed and updated as indicated. Interim medical history since our last visit reviewed. Allergies and medications reviewed and updated. DATA REVIEWED: CHART IN EPIC  Family History reviewed for pertinent findings.  Review of Systems  Constitutional: Negative.   HENT: Negative.   Eyes: Negative.   Respiratory: Negative.   Gastrointestinal: Negative.   Genitourinary: Negative.   Skin: Positive for color change and rash.    Allergies as of 07/20/2018   No Known Allergies     Medication List       Accurate as of July 20, 2018 11:59 PM. Always use your most recent med list.        augmented betamethasone dipropionate 0.05 % cream Commonly known as:  DIPROLENE-AF Apply topically 2 (two) times daily.   empagliflozin 10 MG Tabs tablet Commonly known as:  JARDIANCE Take 10 mg by mouth daily.   JANUVIA 100 MG tablet Generic drug:  sitaGLIPtin Take 1 tablet (100 mg total) by mouth daily.   metFORMIN 500 MG tablet Commonly known as:  GLUCOPHAGE Take 1 tablet (500 mg total) by mouth daily with breakfast.   olmesartan 40 MG tablet Commonly known as:   BENICAR Take 1 tablet (40 mg total) by mouth daily.   omeprazole 20 MG capsule Commonly known as:  PRILOSEC Take 1 capsule (20 mg total) by mouth daily.   ONE TOUCH ULTRA 2 w/Device Kit   ONE TOUCH ULTRA TEST test strip Generic drug:  glucose blood   ONETOUCH DELICA LANCETS 56L Misc   pravastatin 40 MG tablet Commonly known as:  PRAVACHOL Take 1 tablet (40 mg total) by mouth daily.          Objective:    BP (!) 165/78   Pulse 92   Temp (!) 97.2 F (36.2 C) (Oral)   Ht _1  (1.397 m)   Wt 146 lb (66.2 kg)   BMI 33.93 kg/m   No Known Allergies  Wt Readings from Last 3 Encounters:  07/20/18 146 lb (66.2 kg)  06/15/18 144 lb 3.2 oz (65.4 kg)  12/08/17 153 lb (69.4 kg)    Physical Exam Constitutional:      Appearance: She is well-developed.  HENT:     Head: Normocephalic and atraumatic.  Eyes:     Conjunctiva/sclera: Conjunctivae normal.     Pupils: Pupils are equal, round, and reactive to light.  Cardiovascular:     Rate and Rhythm: Normal rate and regular rhythm.  Heart sounds: Normal heart sounds.  Pulmonary:     Effort: Pulmonary effort is normal.     Breath sounds: Normal breath sounds.  Abdominal:     General: Bowel sounds are normal.     Palpations: Abdomen is soft.  Skin:    General: Skin is warm and dry.     Findings: Lesion present. No rash.     Comments: 10 skin tags on neck  Neurological:     Mental Status: She is alert and oriented to person, place, and time.     Deep Tendon Reflexes: Reflexes are normal and symmetric.  Psychiatric:        Behavior: Behavior normal.        Thought Content: Thought content normal.        Judgment: Judgment normal.         Assessment & Plan:   1. Skin tags, multiple acquired cryotherapy given    Continue all other maintenance medications as listed above.  Follow up plan: No follow-ups on file.  Educational handout given for wound care  Terald Sleeper PA-C [facility] [address]  [place], [place] [address] [phone]   07/22/2018, 9:13 PM

## 2018-07-29 ENCOUNTER — Telehealth: Payer: Self-pay | Admitting: *Deleted

## 2018-07-29 NOTE — Telephone Encounter (Signed)
Her last A1c was 7.4 in December. Encourage patient to reduce sugar in her diet.

## 2018-07-29 NOTE — Telephone Encounter (Signed)
Mavis NP with [employer] called to inform Prudy FeelerAngel Jones, PA that she was doing a home visit with patient, and checked a urinalysis dip on her. She states that patient had 4+ glucose in her urine.  Advised Mavis I will forward message to Prudy FeelerAngel Jones, PA.

## 2018-07-30 NOTE — Telephone Encounter (Signed)
Pt aware-called

## 2018-12-03 ENCOUNTER — Telehealth: Payer: Self-pay | Admitting: Physician Assistant

## 2018-12-15 ENCOUNTER — Ambulatory Visit (INDEPENDENT_AMBULATORY_CARE_PROVIDER_SITE_OTHER): Payer: Medicare Other | Admitting: Physician Assistant

## 2018-12-15 ENCOUNTER — Encounter: Payer: Self-pay | Admitting: Physician Assistant

## 2018-12-15 ENCOUNTER — Other Ambulatory Visit: Payer: Self-pay

## 2018-12-15 DIAGNOSIS — E785 Hyperlipidemia, unspecified: Secondary | ICD-10-CM

## 2018-12-15 DIAGNOSIS — E1159 Type 2 diabetes mellitus with other circulatory complications: Secondary | ICD-10-CM

## 2018-12-15 DIAGNOSIS — E119 Type 2 diabetes mellitus without complications: Secondary | ICD-10-CM | POA: Diagnosis not present

## 2018-12-15 DIAGNOSIS — I10 Essential (primary) hypertension: Secondary | ICD-10-CM | POA: Diagnosis not present

## 2018-12-15 DIAGNOSIS — I152 Hypertension secondary to endocrine disorders: Secondary | ICD-10-CM

## 2018-12-15 LAB — BAYER DCA HB A1C WAIVED: HB A1C (BAYER DCA - WAIVED): 7.1 % — ABNORMAL HIGH (ref <7.0)

## 2018-12-15 LAB — LIPID PANEL

## 2018-12-15 MED ORDER — PRAVASTATIN SODIUM 40 MG PO TABS: 40.0000 mg | ORAL_TABLET | Freq: Every day | ORAL | 3 refills | Status: DC

## 2018-12-15 MED ORDER — METFORMIN HCL 500 MG PO TABS: 500.0000 mg | ORAL_TABLET | Freq: Every day | ORAL | 3 refills | Status: DC

## 2018-12-15 MED ORDER — OMEPRAZOLE 20 MG PO CPDR: 20.0000 mg | DELAYED_RELEASE_CAPSULE | Freq: Every day | ORAL | 3 refills | Status: DC

## 2018-12-15 MED ORDER — EMPAGLIFLOZIN 10 MG PO TABS: 10.0000 mg | ORAL_TABLET | Freq: Every day | ORAL | 3 refills | Status: DC

## 2018-12-15 MED ORDER — JANUVIA 100 MG PO TABS: 100.0000 mg | ORAL_TABLET | Freq: Every day | ORAL | 3 refills | Status: DC

## 2018-12-15 MED ORDER — OLMESARTAN MEDOXOMIL 40 MG PO TABS: 40.0000 mg | ORAL_TABLET | Freq: Every day | ORAL | 3 refills | Status: DC

## 2018-12-16 LAB — CMP14+EGFR
ALT: 16 IU/L (ref 0–32)
AST: 11 IU/L (ref 0–40)
Albumin/Globulin Ratio: 1.5 (ref 1.2–2.2)
Albumin: 4.4 g/dL (ref 3.8–4.9)
Alkaline Phosphatase: 72 IU/L (ref 39–117)
BUN/Creatinine Ratio: 15 (ref 12–28)
BUN: 13 mg/dL (ref 8–27)
Bilirubin Total: 0.2 mg/dL (ref 0.0–1.2)
CO2: 23 mmol/L (ref 20–29)
Calcium: 10.1 mg/dL (ref 8.7–10.3)
Chloride: 105 mmol/L (ref 96–106)
Creatinine, Ser: 0.88 mg/dL (ref 0.57–1.00)
GFR calc Af Amer: 83 mL/min/{1.73_m2} (ref >59)
GFR calc non Af Amer: 72 mL/min/{1.73_m2} (ref >59)
Globulin, Total: 2.9 g/dL (ref 1.5–4.5)
Glucose: 173 mg/dL — ABNORMAL HIGH (ref 65–99)
Potassium: 4.6 mmol/L (ref 3.5–5.2)
Sodium: 146 mmol/L — ABNORMAL HIGH (ref 134–144)
Total Protein: 7.3 g/dL (ref 6.0–8.5)

## 2018-12-16 LAB — CBC WITH DIFFERENTIAL/PLATELET
Basophils Absolute: 0 10*3/uL (ref 0.0–0.2)
Basos: 1 %
EOS (ABSOLUTE): 0.1 10*3/uL (ref 0.0–0.4)
Eos: 1 %
Hematocrit: 36.7 % (ref 34.0–46.6)
Hemoglobin: 11.7 g/dL (ref 11.1–15.9)
Immature Grans (Abs): 0 10*3/uL (ref 0.0–0.1)
Immature Granulocytes: 0 %
Lymphocytes Absolute: 2.4 10*3/uL (ref 0.7–3.1)
Lymphs: 31 %
MCH: 26.3 pg — ABNORMAL LOW (ref 26.6–33.0)
MCHC: 31.9 g/dL (ref 31.5–35.7)
MCV: 83 fL (ref 79–97)
Monocytes Absolute: 0.5 10*3/uL (ref 0.1–0.9)
Monocytes: 7 %
Neutrophils Absolute: 4.7 10*3/uL (ref 1.4–7.0)
Neutrophils: 60 %
Platelets: 387 10*3/uL (ref 150–450)
RBC: 4.45 x10E6/uL (ref 3.77–5.28)
RDW: 14.6 % (ref 11.7–15.4)
WBC: 7.8 10*3/uL (ref 3.4–10.8)

## 2018-12-16 LAB — LIPID PANEL
Chol/HDL Ratio: 4.5 ratio — ABNORMAL HIGH (ref 0.0–4.4)
Cholesterol, Total: 154 mg/dL (ref 100–199)
HDL: 34 mg/dL — ABNORMAL LOW (ref >39)
LDL Calculated: 89 mg/dL (ref 0–99)
Triglycerides: 156 mg/dL — ABNORMAL HIGH (ref 0–149)
VLDL Cholesterol Cal: 31 mg/dL (ref 5–40)

## 2018-12-16 LAB — TSH: TSH: 1.11 u[IU]/mL (ref 0.450–4.500)

## 2018-12-20 ENCOUNTER — Encounter: Payer: Self-pay | Admitting: Physician Assistant

## 2018-12-20 NOTE — Progress Notes (Signed)
BP (!) 166/78   Pulse 96   Temp 99 F (37.2 C) (Oral)   Ht '4\' 7"'$  (1.397 m)   Wt 139 lb 12.8 oz (63.4 kg)   BMI 32.49 kg/m    Subjective:    Patient ID: Shelley Wood, female    DOB: 1956/02/11, 63 y.o.   MRN: [id number]  HPI: Shelley Wood is a 63 y.o. female presenting on 12/15/2018 for Diabetes (6 month )  This patient comes in for periodic recheck on her chronic medical conditions which include hyperlipidemia, diabetes, hypertension.  She is here with her sister, and they do not have any complaints or no new problems.  She seems to be doing very well and is still up and active.  She does need lab work performed.  Past Medical History:  Diagnosis Date  . Allergy   . Cataract   . Depression   . Diabetes mellitus without complication ([place])   . Emphysema of lung ([place])   . GERD (gastroesophageal reflux disease)   . Heart murmur   . Hyperlipidemia   . Hypertension    Relevant past medical, surgical, family and social history reviewed and updated as indicated. Interim medical history since our last visit reviewed. Allergies and medications reviewed and updated. DATA REVIEWED: CHART IN EPIC  Family History reviewed for pertinent findings.  Review of Systems  Constitutional: Negative.   HENT: Negative.   Eyes: Negative.   Respiratory: Negative.   Gastrointestinal: Negative.   Genitourinary: Negative.     Allergies as of 12/15/2018   No Known Allergies     Medication List       Accurate as of December 15, 2018 11:59 PM. If you have any questions, ask your nurse or doctor.        augmented betamethasone dipropionate 0.05 % cream Commonly known as:  DIPROLENE-AF Apply topically 2 (two) times daily.   empagliflozin 10 MG Tabs tablet Commonly known as:  Jardiance Take 10 mg by mouth daily.   Januvia 100 MG tablet Generic drug:  sitaGLIPtin Take 1 tablet (100 mg total) by mouth daily.   metFORMIN 500 MG tablet Commonly known as:  GLUCOPHAGE Take 1 tablet (500 mg  total) by mouth daily with breakfast.   olmesartan 40 MG tablet Commonly known as:  BENICAR Take 1 tablet (40 mg total) by mouth daily.   omeprazole 20 MG capsule Commonly known as:  PRILOSEC Take 1 capsule (20 mg total) by mouth daily.   ONE TOUCH ULTRA 2 w/Device Kit   ONE TOUCH ULTRA TEST test strip Generic drug:  glucose blood   OneTouch Delica Lancets 84X Misc   pravastatin 40 MG tablet Commonly known as:  PRAVACHOL Take 1 tablet (40 mg total) by mouth daily.          Objective:    BP (!) 166/78   Pulse 96   Temp 99 F (37.2 C) (Oral)   Ht '4\' 7"'$  (1.397 m)   Wt 139 lb 12.8 oz (63.4 kg)   BMI 32.49 kg/m   No Known Allergies  Wt Readings from Last 3 Encounters:  12/15/18 139 lb 12.8 oz (63.4 kg)  07/20/18 146 lb (66.2 kg)  06/15/18 144 lb 3.2 oz (65.4 kg)    Physical Exam Constitutional:      Appearance: She is well-developed.  HENT:     Head: Normocephalic and atraumatic.  Eyes:     Conjunctiva/sclera: Conjunctivae normal.     Pupils: Pupils are equal, round, and reactive to  light.  Cardiovascular:     Rate and Rhythm: Normal rate and regular rhythm.     Heart sounds: Normal heart sounds.  Pulmonary:     Effort: Pulmonary effort is normal.     Breath sounds: Normal breath sounds.  Abdominal:     General: Bowel sounds are normal.     Palpations: Abdomen is soft.  Skin:    General: Skin is warm and dry.     Findings: No rash.  Neurological:     Mental Status: She is alert and oriented to person, place, and time.     Deep Tendon Reflexes: Reflexes are normal and symmetric.  Psychiatric:        Behavior: Behavior normal.        Thought Content: Thought content normal.        Judgment: Judgment normal.     Results for orders placed or performed in visit on 12/15/18  Lipid panel  Result Value Ref Range   Cholesterol, Total 154 100 - 199 mg/dL   Triglycerides 156 (H) 0 - 149 mg/dL   HDL 34 (L) >39 mg/dL   VLDL Cholesterol Cal 31 5 - 40 mg/dL    LDL Calculated 89 0 - 99 mg/dL   Chol/HDL Ratio 4.5 (H) 0.0 - 4.4 ratio  CBC with Differential/Platelet  Result Value Ref Range   WBC 7.8 3.4 - 10.8 x10E3/uL   RBC 4.45 3.77 - 5.28 x10E6/uL   Hemoglobin 11.7 11.1 - 15.9 g/dL   Hematocrit 36.7 34.0 - 46.6 %   MCV 83 79 - 97 fL   MCH 26.3 (L) 26.6 - 33.0 pg   MCHC 31.9 31.5 - 35.7 g/dL   RDW 14.6 11.7 - 15.4 %   Platelets 387 150 - 450 x10E3/uL   Neutrophils 60 Not Estab. %   Lymphs 31 Not Estab. %   Monocytes 7 Not Estab. %   Eos 1 Not Estab. %   Basos 1 Not Estab. %   Neutrophils Absolute 4.7 1.4 - 7.0 x10E3/uL   Lymphocytes Absolute 2.4 0.7 - 3.1 x10E3/uL   Monocytes Absolute 0.5 0.1 - 0.9 x10E3/uL   EOS (ABSOLUTE) 0.1 0.0 - 0.4 x10E3/uL   Basophils Absolute 0.0 0.0 - 0.2 x10E3/uL   Immature Granulocytes 0 Not Estab. %   Immature Grans (Abs) 0.0 0.0 - 0.1 x10E3/uL  CMP14+EGFR  Result Value Ref Range   Glucose 173 (H) 65 - 99 mg/dL   BUN 13 8 - 27 mg/dL   Creatinine, Ser 0.88 0.57 - 1.00 mg/dL   GFR calc non Af Amer 72 >59 mL/min/1.73   GFR calc Af Amer 83 >59 mL/min/1.73   BUN/Creatinine Ratio 15 12 - 28   Sodium 146 (H) 134 - 144 mmol/L   Potassium 4.6 3.5 - 5.2 mmol/L   Chloride 105 96 - 106 mmol/L   CO2 23 20 - 29 mmol/L   Calcium 10.1 8.7 - 10.3 mg/dL   Total Protein 7.3 6.0 - 8.5 g/dL   Albumin 4.4 3.8 - 4.9 g/dL   Globulin, Total 2.9 1.5 - 4.5 g/dL   Albumin/Globulin Ratio 1.5 1.2 - 2.2   Bilirubin Total 0.2 0.0 - 1.2 mg/dL   Alkaline Phosphatase 72 39 - 117 IU/L   AST 11 0 - 40 IU/L   ALT 16 0 - 32 IU/L  TSH  Result Value Ref Range   TSH 1.110 0.450 - 4.500 uIU/mL  Bayer DCA Hb A1c Waived  Result Value Ref Range   HB  A1C (BAYER DCA - WAIVED) 7.1 (H) <7.0 %      Assessment & Plan:   1. Hyperlipidemia, unspecified hyperlipidemia type - Lipid panel - CBC with Differential/Platelet - CMP14+EGFR - TSH - Bayer DCA Hb A1c Waived - pravastatin (PRAVACHOL) 40 MG tablet; Take 1 tablet (40 mg total) by  mouth daily.  Dispense: 90 tablet; Refill: 3  2. Type 2 diabetes mellitus without complication, without long-term current use of insulin ([facility]) - Lipid panel - CBC with Differential/Platelet - CMP14+EGFR - TSH - Bayer DCA Hb A1c Waived - empagliflozin (JARDIANCE) 10 MG TABS tablet; Take 10 mg by mouth daily.  Dispense: 90 tablet; Refill: 3 - JANUVIA 100 MG tablet; Take 1 tablet (100 mg total) by mouth daily.  Dispense: 90 tablet; Refill: 3 - metFORMIN (GLUCOPHAGE) 500 MG tablet; Take 1 tablet (500 mg total) by mouth daily with breakfast.  Dispense: 90 tablet; Refill: 3  3. Hypertension associated with diabetes ([place]) - Lipid panel - CBC with Differential/Platelet - CMP14+EGFR - TSH - Bayer DCA Hb A1c Waived - olmesartan (BENICAR) 40 MG tablet; Take 1 tablet (40 mg total) by mouth daily.  Dispense: 90 tablet; Refill: 3   Continue all other maintenance medications as listed above.  Follow up plan: Recheck 6 months  Educational handout given for [place] PA-C [facility] [address]  [place], [place] [address] [phone]   12/20/2018, 1:29 PM

## 2018-12-31 ENCOUNTER — Other Ambulatory Visit: Payer: Self-pay

## 2018-12-31 ENCOUNTER — Ambulatory Visit (INDEPENDENT_AMBULATORY_CARE_PROVIDER_SITE_OTHER): Payer: Medicare Other | Admitting: *Deleted

## 2018-12-31 VITALS — Ht <= 58 in | Wt 140.0 lb

## 2018-12-31 DIAGNOSIS — Z0000 Encounter for general adult medical examination without abnormal findings: Secondary | ICD-10-CM

## 2018-12-31 NOTE — Progress Notes (Signed)
MEDICARE ANNUAL WELLNESS VISIT  12/31/2018  Telephone Visit Disclaimer This Medicare AWV was conducted by telephone due to national recommendations for restrictions regarding the COVID-19 Pandemic (e.g. social distancing).  I verified, using two identifiers, that I am speaking with Shelley Wood or their authorized healthcare agent. I discussed the limitations, risks, security, and privacy concerns of performing an evaluation and management service by telephone and the potential availability of an in-person appointment in the future. The patient expressed understanding and agreed to proceed.   Subjective:  Shelley Wood is a 63 y.o. female patient of Terald Sleeper, PA-C who had a Medicare Annual Wellness Visit today via telephone. Shelley Wood is Disabled and lives with their family. she has no children. she reports that she is socially active and does interact with friends/family regularly. she is not physically active and enjoys dancing.  Patient Care Team: Theodoro Clock as PCP - General (Physician Assistant)  Advanced Directives 12/31/2018  Does Patient Have a Medical Advance Directive? No  Would patient like information on creating a medical advance directive? Yes (MAU/Ambulatory/Procedural Areas - Information given)    Hospital Utilization Over the Past 12 Months: # of hospitalizations or ER visits: 0 # of surgeries: 0  Review of Systems    Patient reports that her overall health is unchanged compared to last year.  Patient Reported Readings (BP, Pulse, CBG, Weight, etc) none  Review of Systems: History obtained from chart review and the patient General ROS: negative  All other systems negative.  Pain Assessment Pain : No/denies pain     Current Medications & Allergies (verified) Allergies as of 12/31/2018   No Known Allergies     Medication List       Accurate as of December 31, 2018  4:02 PM. If you have any questions, ask your nurse or doctor.         augmented betamethasone dipropionate 0.05 % cream Commonly known as: DIPROLENE-AF Apply topically 2 (two) times daily.   empagliflozin 10 MG Tabs tablet Commonly known as: Jardiance Take 10 mg by mouth daily.   Januvia 100 MG tablet Generic drug: sitaGLIPtin Take 1 tablet (100 mg total) by mouth daily.   metFORMIN 500 MG tablet Commonly known as: GLUCOPHAGE Take 1 tablet (500 mg total) by mouth daily with breakfast.   olmesartan 40 MG tablet Commonly known as: BENICAR Take 1 tablet (40 mg total) by mouth daily.   omeprazole 20 MG capsule Commonly known as: PRILOSEC Take 1 capsule (20 mg total) by mouth daily.   ONE TOUCH ULTRA 2 w/Device Kit   ONE TOUCH ULTRA TEST test strip Generic drug: glucose blood   OneTouch Delica Lancets 28Z Misc   pravastatin 40 MG tablet Commonly known as: PRAVACHOL Take 1 tablet (40 mg total) by mouth daily.       History (reviewed): Past Medical History:  Diagnosis Date  . Allergy   . Cataract   . Depression   . Diabetes mellitus without complication ([place])   . Emphysema of lung ([place])   . GERD (gastroesophageal reflux disease)   . Heart murmur   . Hyperlipidemia   . Hypertension    History reviewed. No pertinent surgical history. Family History  Problem Relation Age of Onset  . Arthritis Mother   . Heart disease Mother   . Hypertension Mother   . Stroke Mother   . Alcohol abuse Father   . Arthritis Father   . Early death Father   . Heart  disease Father   . Arthritis Sister   . Birth defects Sister   . Cancer Sister   . COPD Sister   . Hyperlipidemia Sister   . Hypertension Sister   . Arthritis Brother   . Hyperlipidemia Brother   . Hypertension Brother   . Arthritis Maternal Grandmother   . Arthritis Maternal Grandfather   . Arthritis Brother   . Arthritis Sister   . Cancer Sister   . Arthritis Sister   . Learning disabilities Sister   . Mental retardation Sister   . Arthritis Sister   . Arthritis Sister     Social History   Socioeconomic History  . Marital status: Single    Spouse name: Not on file  . Number of children: Not on file  . Years of education: Not on file  . Highest education level: Not on file  Occupational History  . Occupation: Disabled  Social Needs  . Financial resource strain: Not very hard  . Food insecurity    Worry: Never true    Inability: Never true  . Transportation needs    Medical: No    Non-medical: No  Tobacco Use  . Smoking status: Never Smoker  . Smokeless tobacco: Never Used  Substance and Sexual Activity  . Alcohol use: No  . Drug use: No  . Sexual activity: Never  Lifestyle  . Physical activity    Days per week: 0 days    Minutes per session: 0 min  . Stress: To some extent  Relationships  . Social connections    Talks on phone: More than three times a week    Gets together: More than three times a week    Attends religious service: More than 4 times per year    Active member of club or organization: Yes    Attends meetings of clubs or organizations: More than 4 times per year    Relationship status: Never married  Other Topics Concern  . Not on file  Social History Narrative  . Not on file    Activities of Daily Living In your present state of health, do you have any difficulty performing the following activities: 12/31/2018  Hearing? N  Vision? N  Difficulty concentrating or making decisions? Y  Walking or climbing stairs? N  Dressing or bathing? Y  Doing errands, shopping? Y  Preparing Food and eating ? Y  Using the Toilet? N  In the past six months, have you accidently leaked urine? Y  Do you have problems with loss of bowel control? N  Managing your Medications? Y  Managing your Finances? Y  Housekeeping or managing your Housekeeping? Y  Some recent data might be hidden    Patient Literacy How often do you need to have someone help you when you read instructions, pamphlets, or other written materials from your doctor or  pharmacy?: 4 - Often  Exercise Current Exercise Habits: The patient does not participate in regular exercise at present  Diet Patient reports consuming 3 meals a day and 1 snack(s) a day Patient reports that her primary diet is: Regular Patient reports that she does have regular access to food.   Depression Screen PHQ 2/9 Scores 12/31/2018 12/15/2018 07/20/2018 06/15/2018 12/08/2017 06/09/2017  PHQ - 2 Score 0 0 0 0 0 0     Fall Risk Fall Risk  12/31/2018 07/20/2018 06/15/2018 12/08/2017 06/09/2017  Falls in the past year? 0 0 0 No No  Number falls in past yr: 0 - - - -  Injury with Fall? 0 - - - -     Objective:  Shelley Wood seemed alert and oriented and she participated appropriately during our telephone visit.  Blood Pressure Weight BMI  BP Readings from Last 3 Encounters:  12/15/18 (!) 166/78  07/20/18 (!) 165/78  06/15/18 139/76   Wt Readings from Last 3 Encounters:  12/31/18 140 lb (63.5 kg)  12/15/18 139 lb 12.8 oz (63.4 kg)  07/20/18 146 lb (66.2 kg)   BMI Readings from Last 1 Encounters:  12/31/18 32.54 kg/m    *Unable to obtain current vital signs, weight, and BMI due to telephone visit type  Hearing/Vision  . Jazzmyne did not seem to have difficulty with hearing/understanding during the telephone conversation . Reports that she has not had a formal eye exam by an eye care professional within the past year . Reports that she has not had a formal hearing evaluation within the past year *Unable to fully assess hearing and vision during telephone visit type  Cognitive Function: 6CIT Screen 12/31/2018  What Year? 0 points  What month? 0 points  What time? 0 points  Count back from 20 2 points  Months in reverse 4 points  Repeat phrase 4 points  Total Score 10   (Normal:0-7, Significant for Dysfunction: >8)  Normal Cognitive Function Screening: No: Patient disabled    Immunization & Health Maintenance Record Immunization History  Administered Date(s)  Administered  . Influenza,inj,Quad PF,6+ Mos 06/09/2017, 06/15/2018    Health Maintenance  Topic Date Due  . Hepatitis C Screening  03/26/1956  . PNEUMOCOCCAL POLYSACCHARIDE VACCINE AGE 464-64 HIGH RISK  01/26/1960  . FOOT EXAM  01/26/1968  . OPHTHALMOLOGY EXAM  01/26/1968  . HIV Screening  01/25/1973  . TETANUS/TDAP  01/25/1977  . PAP SMEAR-Modifier  01/26/1979  . COLONOSCOPY  01/26/2008  . INFLUENZA VACCINE  02/12/2019  . HEMOGLOBIN A1C  06/16/2019  . MAMMOGRAM  08/12/2019       Assessment  This is a routine wellness examination for Ketra Duchesne.  Health Maintenance: Due or Overdue Health Maintenance Due  Topic Date Due  . Hepatitis C Screening  [birth year]  . PNEUMOCOCCAL POLYSACCHARIDE VACCINE AGE 464-64 HIGH RISK  01/26/1960  . FOOT EXAM  01/26/1968  . OPHTHALMOLOGY EXAM  01/26/1968  . HIV Screening  01/25/1973  . TETANUS/TDAP  01/25/1977  . PAP SMEAR-Modifier  01/26/1979  . COLONOSCOPY  01/26/2008    Shelley Wood does not need a referral for Community Assistance: Care Management:   no Social Work:    no Prescription Assistance:  no Nutrition/Diabetes Education:  no   Plan:  Personalized Goals Goals Addressed            This Visit's Progress   . Exercise 3x per week (30 min per time)       Try to exercise for at least 30 minutes 3 times weekly.       Personalized Health Maintenance & Screening Recommendations  Pneumococcal vaccine  Td vaccine Colorectal cancer screening Glaucoma screening Advanced directives: has NO advanced directive  - add't info requested. Referral to SW: no Shingrix  Lung Cancer Screening Recommended: no (Low Dose CT Chest recommended if Age 21-80 years, 30 pack-year currently smoking OR have quit w/in past 15 years) Hepatitis C Screening recommended: no HIV Screening recommended: no  Advanced Directives: Written information was prepared per patient's request.  Referrals & Orders No orders of the defined types were placed  in this encounter.   Follow-up Plan . Follow-up with  Terald Sleeper, PA-C as planned   I have personally reviewed and noted the following in the patient's chart:   . Medical and social history . Use of alcohol, tobacco or illicit drugs  . Current medications and supplements . Functional ability and status . Nutritional status . Physical activity . Advanced directives . List of other physicians . Hospitalizations, surgeries, and ER visits in previous 12 months . Vitals . Screenings to include cognitive, depression, and falls . Referrals and appointments  In addition, I have reviewed and discussed with Shelley Wood certain preventive protocols, quality metrics, and best practice recommendations. A written personalized care plan for preventive services as well as general preventive health recommendations is available and can be mailed to the patient at her request.      Wardell Heath, LPN      3/47/4259   I have reviewed and agree with the above AWV documentation.   Evelina Dun, FNP

## 2018-12-31 NOTE — Patient Instructions (Signed)
Shelley Wood , Thank you for taking time to come for your Medicare Wellness Visit. I appreciate your ongoing commitment to your health goals. Please review the following plan we discussed and let me know if I can assist you in the future.   These are the goals we discussed: Goals    . Exercise 3x per week (30 min per time)     Try to exercise for at least 30 minutes 3 times weekly.        This is a list of the screening recommended for you and due dates:  Health Maintenance  Topic Date Due  .  Hepatitis C: One time screening is recommended by Center for Disease Control  (CDC) for  adults born from [birth year] through 1963.   1956/06/17  . Pneumococcal vaccine  01/26/1960  . Complete foot exam   01/26/1968  . Eye exam for diabetics  01/26/1968  . HIV Screening  01/25/1973  . Tetanus Vaccine  01/25/1977  . Pap Smear  01/26/1979  . Colon Cancer Screening  01/26/2008  . Flu Shot  02/12/2019  . Hemoglobin A1C  06/16/2019  . Mammogram  08/12/2019     Advance Directive  Advance directives are legal documents that let you make choices ahead of time about your health care and medical treatment in case you become unable to communicate for yourself. Advance directives are a way for you to communicate your wishes to family, friends, and health care providers. This can help convey your decisions about end-of-life care if you become unable to communicate. Discussing and writing advance directives should happen over time rather than all at once. Advance directives can be changed depending on your situation and what you want, even after you have signed the advance directives. If you do not have an advance directive, some states assign family decision makers to act on your behalf based on how closely you are related to them. Each state has its own laws regarding advance directives. You may want to check with your health care provider, attorney, or state representative about the laws in your state. There are  different types of advance directives, such as:  Medical power of attorney.  Living will.  Do not resuscitate (DNR) or do not attempt resuscitation (DNAR) order. Health care proxy and medical power of attorney A health care proxy, also called a health care agent, is a person who is appointed to make medical decisions for you in cases in which you are unable to make the decisions yourself. Generally, people choose someone they know well and trust to represent their preferences. Make sure to ask this person for an agreement to act as your proxy. A proxy may have to exercise judgment in the event of a medical decision for which your wishes are not known. A medical power of attorney is a legal document that names your health care proxy. Depending on the laws in your state, after the document is written, it may also need to be:  Signed.  Notarized.  Dated.  Copied.  Witnessed.  Incorporated into your medical record. You may also want to appoint someone to manage your financial affairs in a situation in which you are unable to do so. This is called a durable power of attorney for finances. It is a separate legal document from the durable power of attorney for health care. You may choose the same person or someone different from your health care proxy to act as your agent in financial matters. If you  do not appoint a proxy, or if there is a concern that the proxy is not acting in your best interests, a court-appointed guardian may be designated to act on your behalf. Living will A living will is a set of instructions documenting your wishes about medical care when you cannot express them yourself. Health care providers should keep a copy of your living will in your medical record. You may want to give a copy to family members or friends. To alert caregivers in case of an emergency, you can place a card in your wallet to let them know that you have a living will and where they can find it. A living  will is used if you become:  Terminally ill.  Incapacitated.  Unable to communicate or make decisions. Items to consider in your living will include:  The use or non-use of life-sustaining equipment, such as dialysis machines and breathing machines (ventilators).  A DNR or DNAR order, which is the instruction not to use cardiopulmonary resuscitation (CPR) if breathing or heartbeat stops.  The use or non-use of tube feeding.  Withholding of food and fluids.  Comfort (palliative) care when the goal becomes comfort rather than a cure.  Organ and tissue donation. A living will does not give instructions for distributing your money and property if you should pass away. It is recommended that you seek the advice of a lawyer when writing a will. Decisions about taxes, beneficiaries, and asset distribution will be legally binding. This process can relieve your family and friends of any concerns surrounding disputes or questions that may come up about the distribution of your assets. DNR or DNAR A DNR or DNAR order is a request not to have CPR in the event that your heart stops beating or you stop breathing. If a DNR or DNAR order has not been made and shared, a health care provider will try to help any patient whose heart has stopped or who has stopped breathing. If you plan to have surgery, talk with your health care provider about how your DNR or DNAR order will be followed if problems occur. Summary  Advance directives are the legal documents that allow you to make choices ahead of time about your health care and medical treatment in case you become unable to communicate for yourself.  The process of discussing and writing advance directives should happen over time. You can change the advance directives, even after you have signed them.  Advance directives include DNR or DNAR orders, living wills, and designating an agent as your medical power of attorney. This information is not intended to  replace advice given to you by your health care provider. Make sure you discuss any questions you have with your health care provider. Document Released: 10/07/2007 Document Revised: 05/19/2016 Document Reviewed: 05/19/2016 Elsevier Interactive Patient Education  2019 [employer]

## 2019-03-22 ENCOUNTER — Ambulatory Visit: Payer: Medicare Other | Admitting: Physician Assistant

## 2019-03-23 ENCOUNTER — Encounter: Payer: Self-pay | Admitting: Physician Assistant

## 2019-09-15 ENCOUNTER — Other Ambulatory Visit: Payer: Self-pay

## 2019-09-15 ENCOUNTER — Ambulatory Visit (INDEPENDENT_AMBULATORY_CARE_PROVIDER_SITE_OTHER): Payer: Medicare Other

## 2019-09-15 DIAGNOSIS — Z23 Encounter for immunization: Secondary | ICD-10-CM | POA: Diagnosis not present

## 2019-09-15 NOTE — Progress Notes (Signed)
Flu vaccine given to right deltoid.  Patient tolerated well.

## 2019-10-27 ENCOUNTER — Telehealth: Payer: Self-pay | Admitting: Physician Assistant

## 2019-10-27 NOTE — Chronic Care Management (AMB) (Signed)
  Chronic Care Management   Note  10/27/2019 Name: Halleigh Comes MRN: [id number] DOB: 08/04/1955  Socorro Kanitz is a 64 y.o. year old female who is a primary care patient of Terald Sleeper, PA-C. I reached out to Burnett Harry by phone today in response to a referral sent by Ms. Mellody Memos health plan.     Ms. Coiro sister [name] was given information about Chronic Care Management services today including:  1. CCM service includes personalized support from designated clinical staff supervised by her physician, including individualized plan of care and coordination with other care providers 2. 24/7 contact phone numbers for assistance for urgent and routine care needs. 3. Service will only be billed when office clinical staff spend 20 minutes or more in a month to coordinate care. 4. Only one practitioner may furnish and bill the service in a calendar month. 5. The patient may stop CCM services at any time (effective at the end of the month) by phone call to the office staff. 6. The patient will be responsible for cost sharing (co-pay) of up to 20% of the service fee (after annual deductible is met).  Patient's sister [name] agreed to services and verbal consent obtained.   Follow up plan: Telephone appointment with care management team member scheduled for: 11/08/2019  Noreene Larsson, [place], [place], [place] [address] Direct Dial: [phone] [email or web address] Website: [email or web address]

## 2019-11-08 ENCOUNTER — Ambulatory Visit (INDEPENDENT_AMBULATORY_CARE_PROVIDER_SITE_OTHER): Payer: Medicare Other | Admitting: Licensed Clinical Social Worker

## 2019-11-08 DIAGNOSIS — E1159 Type 2 diabetes mellitus with other circulatory complications: Secondary | ICD-10-CM | POA: Diagnosis not present

## 2019-11-08 DIAGNOSIS — I10 Essential (primary) hypertension: Secondary | ICD-10-CM

## 2019-11-08 DIAGNOSIS — E119 Type 2 diabetes mellitus without complications: Secondary | ICD-10-CM | POA: Diagnosis not present

## 2019-11-08 DIAGNOSIS — E785 Hyperlipidemia, unspecified: Secondary | ICD-10-CM

## 2019-11-08 DIAGNOSIS — F840 Autistic disorder: Secondary | ICD-10-CM

## 2019-11-08 NOTE — Chronic Care Management (AMB) (Signed)
  Chronic Care Management    Clinical Social Work Follow Up Note  11/08/2019 Name: Shelley Wood MRN: [id number] DOB: 18-Feb-1956  Shelley Wood is a 64 y.o. year old female who is a primary care patient of Detting[place], Vonna Kotyk The CCM team was consulted for assistance with [employer] .   Review of patient status, including review of consultants reports, other relevant assessments, and collaboration with appropriate care team members and the patient's provider was performed as part of comprehensive patient evaluation and provision of chronic care management services.    SDOH (Social Determinants of Health) assessments performed: Yes;risk for stress; risk for physical inactivity  Outpatient Encounter Medications as of 11/08/2019  Medication Sig  . augmented betamethasone dipropionate (DIPROLENE-AF) 0.05 % cream Apply topically 2 (two) times daily.  . Blood Glucose Monitoring Suppl (ONE TOUCH ULTRA 2) w/Device KIT   . empagliflozin (JARDIANCE) 10 MG TABS tablet Take 10 mg by mouth daily.  Marland Kitchen JANUVIA 100 MG tablet Take 1 tablet (100 mg total) by mouth daily.  . metFORMIN (GLUCOPHAGE) 500 MG tablet Take 1 tablet (500 mg total) by mouth daily with breakfast.  . olmesartan (BENICAR) 40 MG tablet Take 1 tablet (40 mg total) by mouth daily.  Marland Kitchen omeprazole (PRILOSEC) 20 MG capsule Take 1 capsule (20 mg total) by mouth daily.  . ONE TOUCH ULTRA TEST test strip   . ONETOUCH DELICA LANCETS 57Q MISC   . pravastatin (PRAVACHOL) 40 MG tablet Take 1 tablet (40 mg total) by mouth daily.   No facility-administered encounter medications on file as of 11/08/2019.    Goals Addressed            This Visit's Progress   . 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 Talked with [name], sister of client, about CCM program support for client . Encouraged for Shelley/client to talk with RNCM as needed for nursing support  . Talked with [name] about relaxation activities of client (likes to watch TV, listens to music, likes to dance, likes to sing,likes to talk with family members via phone) . Talked with [name] about social support network for client (sister) . Talked with [name] about transport needs of client . Talked with [facility] about pain issues of client . Talked with [facility] about client management of Diabetes . Talked with [name] about client completion of ADLs daily  Patient Self Care Activities:   Completes ADLs with assistance Communicates with sister, caregivers and family members Attends scheduled medical appointments Socializes with others  Patient Self Care Deficits: . Autism . Management of Diabetes Challenges  Initial goal documentation       Follow Up Plan: LCSW will call client/Shelley Wood, sister of client, in next 4 weeks to discuss community resources of possible help to client  [name].Eriko Economos MSW, LCSW Licensed Clinical Social Worker [facility] Care Management [phone]

## 2019-11-08 NOTE — Patient Instructions (Addendum)
Licensed Clinical Social Worker Visit Information  Goals we discussed today:  Goals Addressed            This Visit's Progress   . 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 Talked with Shelley Wood, sister of client, about CCM program support for client . Encouraged for Shelley/client to talk with RNCM as needed for nursing support  . Talked with Shelley Wood about relaxation activities of client (likes to watch TV, listens to music, likes to dance, likes to sing,likes to talk with family members via phone) . Talked with Shelley Wood about social support network (sister) . Talked with Shelley Wood about transport needs of client . Talked with [facility] about pain issues of client . Talked with [facility] about client management of Diabetes . Talked with Shelley Wood about client completion of ADLs daily  Patient Self Care Activities:   Completes ADLs with assistance Communicates with sister, caregivers and family members Attends scheduled medical appointments Socializes with others  Patient Self Care Deficits: . Autism . Management of Diabetes Challenges  Initial goal documentation       Materials Provided: No  Follow Up Plan: LCSW will call client/Shelley Wood, sister of client, in next 4 weeks to discuss community resources of possible help to client  The patient/Shelley Wood, sister of client, verbalized understanding of instructions provided today and declined a print copy of patient instruction materials.   Shelley Wood MSW, LCSW Licensed Clinical Social Worker [facility] Care Management [phone]

## 2019-11-29 ENCOUNTER — Other Ambulatory Visit: Payer: Self-pay

## 2019-11-29 DIAGNOSIS — E785 Hyperlipidemia, unspecified: Secondary | ICD-10-CM

## 2019-11-29 MED ORDER — PRAVASTATIN SODIUM 40 MG PO TABS: 40.0000 mg | ORAL_TABLET | Freq: Every day | ORAL | 0 refills | Status: DC

## 2019-12-05 ENCOUNTER — Ambulatory Visit: Payer: Self-pay | Admitting: Licensed Clinical Social Worker

## 2019-12-05 DIAGNOSIS — F840 Autistic disorder: Secondary | ICD-10-CM

## 2019-12-05 DIAGNOSIS — E785 Hyperlipidemia, unspecified: Secondary | ICD-10-CM

## 2019-12-05 DIAGNOSIS — E119 Type 2 diabetes mellitus without complications: Secondary | ICD-10-CM

## 2019-12-05 DIAGNOSIS — E1159 Type 2 diabetes mellitus with other circulatory complications: Secondary | ICD-10-CM

## 2019-12-05 NOTE — Chronic Care Management (AMB) (Signed)
  Chronic Care Management    Clinical Social Work Follow Up Note  12/05/2019 Name: Shelley Wood MRN: [id number] DOB: [birth year]  Shelley Wood is a 64 y.o. year old female who is a primary care patient of Dettinger, Vonna Kotyk, MD. The CCM team was consulted for assistance with community resources.   Review of patient status, including review of consultants reports, other relevant assessments, and collaboration with appropriate care team members and the patient's provider was performed as part of comprehensive patient evaluation and provision of chronic care management services.    SDOH (Social Determinants of Health) assessments performed: No; risk for social isolation; risk for stress; risk for physical inactivity  Outpatient Encounter Medications as of 12/05/2019  Medication Sig  . augmented betamethasone dipropionate (DIPROLENE-AF) 0.05 % cream Apply topically 2 (two) times daily.  . Blood Glucose Monitoring Suppl (ONE TOUCH ULTRA 2) w/Device KIT   . empagliflozin (JARDIANCE) 10 MG TABS tablet Take 10 mg by mouth daily.  Marland Kitchen JANUVIA 100 MG tablet Take 1 tablet (100 mg total) by mouth daily.  . metFORMIN (GLUCOPHAGE) 500 MG tablet Take 1 tablet (500 mg total) by mouth daily with breakfast.  . olmesartan (BENICAR) 40 MG tablet Take 1 tablet (40 mg total) by mouth daily.  Marland Kitchen omeprazole (PRILOSEC) 20 MG capsule Take 1 capsule (20 mg total) by mouth daily.  . ONE TOUCH ULTRA TEST test strip   . ONETOUCH DELICA LANCETS 21R MISC   . pravastatin (PRAVACHOL) 40 MG tablet Take 1 tablet (40 mg total) by mouth daily. Needs to be seen for further refills   No facility-administered encounter medications on file as of 12/05/2019.      LCSW called client phone number today and spoke with Shelley Wood, sister of client. Shelley Wood of client, had previously given consent for client to receive CCM services. However, LCSW has talked 2 times with Shelley Wood about CCM program.  Today, Shelley Wood informed LCSW that  client needs were being met. 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 Shelley Wood said she did not think client had any particular needs at present for CCM program support. She asked for client to be discharged from [facility] program services today.  LCSW invited Shelley Wood or client to contact [facility] in future if they were interested in receiving CCM services for client. Shelley Wood was appreciate of call from LCSW.  Shelley Wood.Shelley Wood MSW, LCSW Licensed Clinical Social Worker [facility] Care Management [phone]

## 2019-12-05 NOTE — Patient Instructions (Addendum)
Licensed Clinical Social Worker Visit Information  Materials Provided: No  12/05/2019   Name: Shelley Wood MRN: [id number] DOB: May 08, 1956   Venisha Boehning is a 64 y.o. year old female who is a primary care patient of Dettinger, Vonna Kotyk, MD. The CCM team was consulted for assistance with community resources.   Review of patient status, including review of consultants reports, other relevant assessments, and collaboration with appropriate care team members and the patient's provider was performed as part of comprehensive patient evaluation and provision of chronic care management services.   SDOH (Social Determinants of Health) assessments performed: No; risk for social isolation; risk for stress; risk for physical inactivity  LCSW called client phone number today and spoke with [name], sister of client. [name] of client, had previously given consent for client to receive CCM services. However, LCSW has talked 2 times with [name] about CCM program.  Today, [name] informed LCSW that client needs were being met. 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 [name] said she did not think client had any particular needs at present for CCM program support. She asked for client to be discharged from [facility] program services today.  LCSW invited [facility] or client to contact [facility] in future if they were interested in receiving CCM services for client. [name] was appreciate of call from LCSW  The patient /[name] Adah Perl, sister of patient , verbalized understanding of instructions provided today and declined a print copy of patient instruction materials.   [name].Forrest MSW, LCSW Licensed Clinical Social Worker [facility] Care [facility] [phone]

## 2019-12-27 ENCOUNTER — Other Ambulatory Visit: Payer: Self-pay | Admitting: *Deleted

## 2019-12-27 DIAGNOSIS — E785 Hyperlipidemia, unspecified: Secondary | ICD-10-CM

## 2019-12-27 NOTE — Telephone Encounter (Signed)
Appointment scheduled with [facility] to est care.

## 2019-12-27 NOTE — Telephone Encounter (Signed)
Former Shelley Wood. NTBS LOV 12/15/18

## 2019-12-29 ENCOUNTER — Ambulatory Visit (INDEPENDENT_AMBULATORY_CARE_PROVIDER_SITE_OTHER): Payer: Medicare Other | Admitting: Family

## 2019-12-29 ENCOUNTER — Encounter: Payer: Self-pay | Admitting: Family

## 2019-12-29 DIAGNOSIS — F840 Autistic disorder: Secondary | ICD-10-CM | POA: Diagnosis not present

## 2019-12-29 DIAGNOSIS — R6250 Unspecified lack of expected normal physiological development in childhood: Secondary | ICD-10-CM

## 2019-12-29 DIAGNOSIS — E1159 Type 2 diabetes mellitus with other circulatory complications: Secondary | ICD-10-CM

## 2019-12-29 DIAGNOSIS — I10 Essential (primary) hypertension: Secondary | ICD-10-CM

## 2019-12-29 DIAGNOSIS — Z1211 Encounter for screening for malignant neoplasm of colon: Secondary | ICD-10-CM

## 2019-12-29 DIAGNOSIS — Z1159 Encounter for screening for other viral diseases: Secondary | ICD-10-CM

## 2019-12-29 DIAGNOSIS — I152 Hypertension secondary to endocrine disorders: Secondary | ICD-10-CM

## 2019-12-29 DIAGNOSIS — E119 Type 2 diabetes mellitus without complications: Secondary | ICD-10-CM

## 2019-12-29 DIAGNOSIS — E785 Hyperlipidemia, unspecified: Secondary | ICD-10-CM

## 2019-12-29 MED ORDER — BLOOD GLUCOSE METER KIT: PACK | 0 refills | Status: AC

## 2019-12-29 NOTE — Progress Notes (Signed)
Virtual Visit via telephone Note Due to COVID-19 pandemic this visit was conducted virtually. This visit type was conducted due to national recommendations for restrictions regarding the COVID-19 Pandemic (e.g. social distancing, sheltering in place) in an effort to limit this patient's exposure and mitigate transmission in our community. All issues noted in this document were discussed and addressed.  A physical exam was not performed with this format.  I connected with Shelley Wood on 12/29/19 at 12:35 pm  by telephone and verified that I am speaking with the correct person using two identifiers. Shelley Wood is currently located at home and sister is currently with her during visit. The provider, Evelina Dun, FNP is located in their office at time of visit.  I discussed the limitations, risks, security and privacy concerns of performing an evaluation and management service by telephone and the availability of in person appointments. I also discussed with the patient that there may be a patient responsible charge related to this service. The patient expressed understanding and agreed to proceed.   History and Present Illness:  Diabetes She presents for her follow-up diabetic visit. She has type 2 diabetes mellitus. Her disease course has been stable. There are no hypoglycemic associated symptoms. Pertinent negatives for diabetes include no blurred vision and no foot paresthesias. Symptoms are stable. Pertinent negatives for diabetic complications include no CVA, nephropathy or peripheral neuropathy. Risk factors for coronary artery disease include dyslipidemia, diabetes mellitus, hypertension, sedentary lifestyle and post-menopausal. Her weight is stable. She is following a generally unhealthy diet. Her overall blood glucose range is 140-180 mg/dl. An ACE inhibitor/angiotensin II receptor blocker is being taken. Eye exam is not current.  Hypertension This is a chronic problem. The current  episode started more than 1 year ago. The problem has been resolved since onset. The problem is controlled. Pertinent negatives include no blurred vision. Risk factors for coronary artery disease include dyslipidemia, diabetes mellitus, obesity and sedentary lifestyle. The current treatment provides moderate improvement. There is no history of kidney disease, CAD/MI or CVA.  Hyperlipidemia This is a chronic problem. The current episode started more than 1 year ago. The problem is uncontrolled. Recent lipid tests were reviewed and are high. Exacerbating diseases include obesity. Current antihyperlipidemic treatment includes statins. The current treatment provides moderate improvement of lipids. Risk factors for coronary artery disease include dyslipidemia, hypertension and a sedentary lifestyle.      Review of Systems  Eyes: Negative for blurred vision.     Observations/Objective: No SOB or distress, sister and patient talked  Assessment and Plan: Shelley Wood comes in today with chief complaint of No chief complaint on file.   Diagnosis and orders addressed:  1. Hypertension associated with diabetes ([place]) - CMP14+EGFR; Future - CBC with Differential/Platelet; Future  2. Type 2 diabetes mellitus without complication, without long-term current use of insulin (HCC) - CMP14+EGFR; Future - CBC with Differential/Platelet; Future - Bayer DCA Hb A1c Waived; Future - Microalbumin / creatinine urine ratio; Future - blood glucose meter kit and supplies; Dispense based on patient and insurance preference. Use up to four times daily as directed. (FOR ICD-10 E10.9, E11.9).  Dispense: 1 each; Refill: 0  3. Autism - CMP14+EGFR; Future - CBC with Differential/Platelet; Future  4. Development delay - CMP14+EGFR; Future - CBC with Differential/Platelet; Future  5. Hyperlipidemia, unspecified hyperlipidemia type - CMP14+EGFR; Future - CBC with Differential/Platelet; Future - Lipid panel;  Future  6. Need for hepatitis C screening test - CMP14+EGFR; Future - CBC with Differential/Platelet;  Future - Hepatitis C antibody; Future  7. Colon cancer screening - CMP14+EGFR; Future - CBC with Differential/Platelet; Future - Cologuard   Labs pending Health Maintenance reviewed Diet and exercise encouraged  Follow up plan: 3 months     I discussed the assessment and treatment plan with the patient. The patient was provided an opportunity to ask questions and all were answered. The patient agreed with the plan and demonstrated an understanding of the instructions.   The patient was advised to call back or seek an in-person evaluation if the symptoms worsen or if the condition fails to improve as anticipated.  The above assessment and management plan was discussed with the patient. The patient verbalized understanding of and has agreed to the management plan. Patient is aware to call the clinic if symptoms persist or worsen. Patient is aware when to return to the clinic for a follow-up visit. Patient educated on when it is appropriate to go to the emergency department.   Time call ended:  12:48 pm  I provided 13 minutes of non-face-to-face time during this encounter.    Evelina Dun, FNP

## 2019-12-31 ENCOUNTER — Other Ambulatory Visit: Payer: Self-pay | Admitting: Family Medicine

## 2019-12-31 DIAGNOSIS — E785 Hyperlipidemia, unspecified: Secondary | ICD-10-CM

## 2020-01-02 ENCOUNTER — Telehealth: Payer: Self-pay | Admitting: Family

## 2020-01-02 NOTE — Telephone Encounter (Signed)
  Prescription Request  01/02/2020  What is the name of the medication or equipment? Pravastatin  Have you contacted your pharmacy to request a refill? (if applicable) Yes  Which pharmacy would you like this sent to? [facility]  Pt needs refill on this Rx. Ran out on 12/30/19. Had televisit with Christy on 12/29/19 to establish care but no refills were sent for this Rx.   Patient notified that their request is being sent to the clinical staff for review and that they should receive a response within 2 business days.

## 2020-01-02 NOTE — Telephone Encounter (Signed)
Aware.  Needs to come in for blood work to fulfill future lab orders.

## 2020-01-06 ENCOUNTER — Other Ambulatory Visit: Payer: Self-pay | Admitting: Family Medicine

## 2020-01-06 DIAGNOSIS — E785 Hyperlipidemia, unspecified: Secondary | ICD-10-CM

## 2020-01-10 ENCOUNTER — Other Ambulatory Visit: Payer: Medicare Other

## 2020-01-10 ENCOUNTER — Other Ambulatory Visit: Payer: Self-pay

## 2020-01-10 DIAGNOSIS — Z1211 Encounter for screening for malignant neoplasm of colon: Secondary | ICD-10-CM

## 2020-01-10 DIAGNOSIS — F840 Autistic disorder: Secondary | ICD-10-CM

## 2020-01-10 DIAGNOSIS — E119 Type 2 diabetes mellitus without complications: Secondary | ICD-10-CM

## 2020-01-10 DIAGNOSIS — Z1159 Encounter for screening for other viral diseases: Secondary | ICD-10-CM

## 2020-01-10 DIAGNOSIS — I152 Hypertension secondary to endocrine disorders: Secondary | ICD-10-CM

## 2020-01-10 DIAGNOSIS — E785 Hyperlipidemia, unspecified: Secondary | ICD-10-CM

## 2020-01-10 DIAGNOSIS — I10 Essential (primary) hypertension: Secondary | ICD-10-CM | POA: Diagnosis not present

## 2020-01-10 DIAGNOSIS — E1159 Type 2 diabetes mellitus with other circulatory complications: Secondary | ICD-10-CM | POA: Diagnosis not present

## 2020-01-10 DIAGNOSIS — R6250 Unspecified lack of expected normal physiological development in childhood: Secondary | ICD-10-CM

## 2020-01-10 LAB — BAYER DCA HB A1C WAIVED: HB A1C (BAYER DCA - WAIVED): 6.9 % (ref <7.0)

## 2020-01-11 ENCOUNTER — Other Ambulatory Visit: Payer: Self-pay | Admitting: Family

## 2020-01-11 LAB — CBC WITH DIFFERENTIAL/PLATELET
Basophils Absolute: 0 10*3/uL (ref 0.0–0.2)
Basos: 1 %
EOS (ABSOLUTE): 0.1 10*3/uL (ref 0.0–0.4)
Eos: 1 %
Hematocrit: 41.7 % (ref 34.0–46.6)
Hemoglobin: 13 g/dL (ref 11.1–15.9)
Immature Grans (Abs): 0 10*3/uL (ref 0.0–0.1)
Immature Granulocytes: 0 %
Lymphocytes Absolute: 2.3 10*3/uL (ref 0.7–3.1)
Lymphs: 35 %
MCH: 26.5 pg — ABNORMAL LOW (ref 26.6–33.0)
MCHC: 31.2 g/dL — ABNORMAL LOW (ref 31.5–35.7)
MCV: 85 fL (ref 79–97)
Monocytes Absolute: 0.4 10*3/uL (ref 0.1–0.9)
Monocytes: 7 %
Neutrophils Absolute: 3.6 10*3/uL (ref 1.4–7.0)
Neutrophils: 56 %
Platelets: 379 10*3/uL (ref 150–450)
RBC: 4.91 x10E6/uL (ref 3.77–5.28)
RDW: 15 % (ref 11.7–15.4)
WBC: 6.5 10*3/uL (ref 3.4–10.8)

## 2020-01-11 LAB — LIPID PANEL
Chol/HDL Ratio: 4 ratio (ref 0.0–4.4)
Cholesterol, Total: 151 mg/dL (ref 100–199)
HDL: 38 mg/dL — ABNORMAL LOW (ref >39)
LDL Chol Calc (NIH): 92 mg/dL (ref 0–99)
Triglycerides: 115 mg/dL (ref 0–149)
VLDL Cholesterol Cal: 21 mg/dL (ref 5–40)

## 2020-01-11 LAB — CMP14+EGFR
ALT: 16 IU/L (ref 0–32)
AST: 8 IU/L (ref 0–40)
Albumin/Globulin Ratio: 1.5 (ref 1.2–2.2)
Albumin: 4.5 g/dL (ref 3.8–4.8)
Alkaline Phosphatase: 72 IU/L (ref 48–121)
BUN/Creatinine Ratio: 17 (ref 12–28)
BUN: 15 mg/dL (ref 8–27)
Bilirubin Total: 0.3 mg/dL (ref 0.0–1.2)
CO2: 23 mmol/L (ref 20–29)
Calcium: 10.1 mg/dL (ref 8.7–10.3)
Chloride: 102 mmol/L (ref 96–106)
Creatinine, Ser: 0.86 mg/dL (ref 0.57–1.00)
GFR calc Af Amer: 84 mL/min/{1.73_m2} (ref >59)
GFR calc non Af Amer: 73 mL/min/{1.73_m2} (ref >59)
Globulin, Total: 3 g/dL (ref 1.5–4.5)
Glucose: 154 mg/dL — ABNORMAL HIGH (ref 65–99)
Potassium: 4.5 mmol/L (ref 3.5–5.2)
Sodium: 142 mmol/L (ref 134–144)
Total Protein: 7.5 g/dL (ref 6.0–8.5)

## 2020-01-11 LAB — MICROALBUMIN / CREATININE URINE RATIO
Creatinine, Urine: 68.3 mg/dL
Microalb/Creat Ratio: 40 mg/g creat — ABNORMAL HIGH (ref 0–29)
Microalbumin, Urine: 27.4 ug/mL

## 2020-01-11 LAB — HEPATITIS C ANTIBODY: Hep C Virus Ab: <0.1 s/co ratio (ref 0.0–0.9)

## 2020-02-09 DIAGNOSIS — L84 Corns and callosities: Secondary | ICD-10-CM | POA: Diagnosis not present

## 2020-02-09 DIAGNOSIS — B351 Tinea unguium: Secondary | ICD-10-CM | POA: Diagnosis not present

## 2020-02-09 DIAGNOSIS — M79676 Pain in unspecified toe(s): Secondary | ICD-10-CM | POA: Diagnosis not present

## 2020-02-09 DIAGNOSIS — E1142 Type 2 diabetes mellitus with diabetic polyneuropathy: Secondary | ICD-10-CM | POA: Diagnosis not present

## 2020-02-29 ENCOUNTER — Other Ambulatory Visit: Payer: Self-pay

## 2020-02-29 DIAGNOSIS — E1159 Type 2 diabetes mellitus with other circulatory complications: Secondary | ICD-10-CM

## 2020-02-29 DIAGNOSIS — E119 Type 2 diabetes mellitus without complications: Secondary | ICD-10-CM

## 2020-02-29 MED ORDER — OLMESARTAN MEDOXOMIL 40 MG PO TABS: 40.0000 mg | ORAL_TABLET | Freq: Every day | ORAL | 1 refills | Status: DC

## 2020-02-29 MED ORDER — JANUVIA 100 MG PO TABS: 100.0000 mg | ORAL_TABLET | Freq: Every day | ORAL | 1 refills | Status: DC

## 2020-02-29 MED ORDER — METFORMIN HCL 500 MG PO TABS: 500.0000 mg | ORAL_TABLET | Freq: Every day | ORAL | 1 refills | Status: DC

## 2020-02-29 MED ORDER — EMPAGLIFLOZIN 10 MG PO TABS: 10.0000 mg | ORAL_TABLET | Freq: Every day | ORAL | 1 refills | Status: DC

## 2020-02-29 MED ORDER — OMEPRAZOLE 20 MG PO CPDR: 20.0000 mg | DELAYED_RELEASE_CAPSULE | Freq: Every day | ORAL | 1 refills | Status: DC

## 2020-05-10 ENCOUNTER — Other Ambulatory Visit: Payer: Self-pay | Admitting: Family Medicine

## 2020-05-10 DIAGNOSIS — E785 Hyperlipidemia, unspecified: Secondary | ICD-10-CM

## 2020-05-24 DIAGNOSIS — E1142 Type 2 diabetes mellitus with diabetic polyneuropathy: Secondary | ICD-10-CM | POA: Diagnosis not present

## 2020-05-24 DIAGNOSIS — L84 Corns and callosities: Secondary | ICD-10-CM | POA: Diagnosis not present

## 2020-05-24 DIAGNOSIS — M79676 Pain in unspecified toe(s): Secondary | ICD-10-CM | POA: Diagnosis not present

## 2020-05-24 DIAGNOSIS — B351 Tinea unguium: Secondary | ICD-10-CM | POA: Diagnosis not present

## 2020-08-08 ENCOUNTER — Other Ambulatory Visit: Payer: Self-pay | Admitting: Family Medicine

## 2020-08-08 DIAGNOSIS — E785 Hyperlipidemia, unspecified: Secondary | ICD-10-CM

## 2020-08-21 ENCOUNTER — Other Ambulatory Visit: Payer: Self-pay | Admitting: Family

## 2020-08-21 DIAGNOSIS — E1159 Type 2 diabetes mellitus with other circulatory complications: Secondary | ICD-10-CM

## 2020-08-21 DIAGNOSIS — E119 Type 2 diabetes mellitus without complications: Secondary | ICD-10-CM

## 2020-08-21 DIAGNOSIS — I152 Hypertension secondary to endocrine disorders: Secondary | ICD-10-CM

## 2020-09-28 ENCOUNTER — Ambulatory Visit (INDEPENDENT_AMBULATORY_CARE_PROVIDER_SITE_OTHER): Payer: Medicare Other | Admitting: Family

## 2020-09-28 ENCOUNTER — Other Ambulatory Visit: Payer: Self-pay

## 2020-09-28 ENCOUNTER — Encounter: Payer: Self-pay | Admitting: Family

## 2020-09-28 VITALS — BP 182/69 | HR 75 | Temp 97.0°F | Ht <= 58 in | Wt 120.6 lb

## 2020-09-28 DIAGNOSIS — Z23 Encounter for immunization: Secondary | ICD-10-CM | POA: Diagnosis not present

## 2020-09-28 DIAGNOSIS — H6123 Impacted cerumen, bilateral: Secondary | ICD-10-CM

## 2020-09-28 DIAGNOSIS — E119 Type 2 diabetes mellitus without complications: Secondary | ICD-10-CM | POA: Diagnosis not present

## 2020-09-28 DIAGNOSIS — E1159 Type 2 diabetes mellitus with other circulatory complications: Secondary | ICD-10-CM | POA: Diagnosis not present

## 2020-09-28 DIAGNOSIS — Z1211 Encounter for screening for malignant neoplasm of colon: Secondary | ICD-10-CM

## 2020-09-28 DIAGNOSIS — E785 Hyperlipidemia, unspecified: Secondary | ICD-10-CM | POA: Diagnosis not present

## 2020-09-28 DIAGNOSIS — R6250 Unspecified lack of expected normal physiological development in childhood: Secondary | ICD-10-CM

## 2020-09-28 DIAGNOSIS — I152 Hypertension secondary to endocrine disorders: Secondary | ICD-10-CM | POA: Diagnosis not present

## 2020-09-28 LAB — BAYER DCA HB A1C WAIVED: HB A1C (BAYER DCA - WAIVED): 6 % (ref <7.0)

## 2020-09-28 MED ORDER — OMEPRAZOLE 20 MG PO CPDR: 20.0000 mg | DELAYED_RELEASE_CAPSULE | Freq: Every day | ORAL | 1 refills | Status: DC

## 2020-09-28 MED ORDER — PRAVASTATIN SODIUM 40 MG PO TABS
40.0000 mg | ORAL_TABLET | Freq: Every day | ORAL | 0 refills | Status: DC
Start: 2020-09-28 — End: 2020-11-16

## 2020-09-28 MED ORDER — EMPAGLIFLOZIN 10 MG PO TABS: 10.0000 mg | ORAL_TABLET | Freq: Every day | ORAL | 1 refills | Status: DC

## 2020-09-28 MED ORDER — AMLODIPINE BESYLATE 5 MG PO TABS: 5.0000 mg | ORAL_TABLET | Freq: Every day | ORAL | 3 refills | Status: DC

## 2020-09-28 MED ORDER — OLMESARTAN MEDOXOMIL 40 MG PO TABS
40.0000 mg | ORAL_TABLET | Freq: Every day | ORAL | 0 refills | Status: DC
Start: 2020-09-28 — End: 2020-11-16

## 2020-09-28 MED ORDER — SITAGLIPTIN PHOSPHATE 100 MG PO TABS
100.0000 mg | ORAL_TABLET | Freq: Every day | ORAL | 2 refills | Status: DC
Start: 2020-09-28 — End: 2021-08-06

## 2020-09-28 MED ORDER — METFORMIN HCL 500 MG PO TABS: 500.0000 mg | ORAL_TABLET | Freq: Every day | ORAL | 2 refills | Status: DC

## 2020-09-28 NOTE — Progress Notes (Signed)
Subjective:    Patient ID: Shelley Wood, female    DOB: 08/17/1955, 65 y.o.   MRN: [id number]  Chief Complaint  Patient presents with  . Hypertension  . Diabetes   Pt presents to the office today for chronic follow up. She lives with her sister.  Hypertension This is a chronic problem. The current episode started more than 1 year ago. The problem has been waxing and waning since onset. The problem is uncontrolled. Pertinent negatives include no blurred vision, malaise/fatigue, peripheral edema or shortness of breath. Risk factors for coronary artery disease include dyslipidemia, diabetes mellitus and sedentary lifestyle. The current treatment provides moderate improvement. There is no history of CVA.  Diabetes She presents for her follow-up diabetic visit. She has type 2 diabetes mellitus. There are no hypoglycemic associated symptoms. Pertinent negatives for diabetes include no blurred vision and no foot paresthesias. Symptoms are stable. Pertinent negatives for diabetic complications include no CVA. Risk factors for coronary artery disease include dyslipidemia, diabetes mellitus, hypertension and sedentary lifestyle. Her overall blood glucose range is 90-110 mg/dl. Eye exam is current.  Hyperlipidemia This is a chronic problem. The current episode started more than 1 year ago. The problem is controlled. Recent lipid tests were reviewed and are normal. Pertinent negatives include no shortness of breath. Current antihyperlipidemic treatment includes statins. The current treatment provides moderate improvement of lipids. Risk factors for coronary artery disease include dyslipidemia, hypertension and a sedentary lifestyle.      Review of Systems  Constitutional: Negative for malaise/fatigue.  Eyes: Negative for blurred vision.  Respiratory: Negative for shortness of breath.        Objective:   Physical Exam Vitals reviewed.  Constitutional:      General: She is not in acute  distress.    Appearance: She is well-developed.  HENT:     Head: Normocephalic and atraumatic.     Right Ear: There is impacted cerumen.     Left Ear: There is impacted cerumen.  Eyes:     Pupils: Pupils are equal, round, and reactive to light.  Neck:     Thyroid: No thyromegaly.  Cardiovascular:     Rate and Rhythm: Normal rate and regular rhythm.     Heart sounds: Normal heart sounds. No murmur heard.   Pulmonary:     Effort: Pulmonary effort is normal. No respiratory distress.     Breath sounds: Normal breath sounds. No wheezing.  Abdominal:     General: Bowel sounds are normal. There is no distension.     Palpations: Abdomen is soft.     Tenderness: There is no abdominal tenderness.  Musculoskeletal:        General: No tenderness. Normal range of motion.     Cervical back: Normal range of motion and neck supple.  Skin:    General: Skin is warm and dry.  Neurological:     Mental Status: She is alert and oriented to person, place, and time.     Cranial Nerves: No cranial nerve deficit.     Deep Tendon Reflexes: Reflexes are normal and symmetric.  Psychiatric:        Behavior: Behavior normal.        Thought Content: Thought content normal.        Cognition and Memory: Cognition is impaired.        Judgment: Judgment normal.     Bilateral ears washed with warm water and peroxide, TM WNL  BP (!) 182/69   Pulse 75  Temp (!) 97 F (36.1 C) (Temporal)   Ht _0  (1.397 m)   Wt 120 lb 9.6 oz (54.7 kg)   BMI 28.03 kg/m      Assessment & Plan:  Cason Dabney comes in today with chief complaint of Hypertension and Diabetes   Diagnosis and orders addressed:  1. Hyperlipidemia, unspecified hyperlipidemia type - pravastatin (PRAVACHOL) 40 MG tablet; Take 1 tablet (40 mg total) by mouth daily. (Needs to be seen before next refill)  Dispense: 30 tablet; Refill: 0 - CMP14+EGFR - CBC with Differential/Platelet - Lipid panel  2. Hypertension associated with diabetes  ([place]) Will add Norvasc 5 mg today -Daily blood pressure log given with instructions on how to fill out and told to bring to next visit -Dash diet information given -Exercise encouraged - Stress Management  -Continue current meds -RTO in 2 weeks  - olmesartan (BENICAR) 40 MG tablet; Take 1 tablet (40 mg total) by mouth daily. (Needs to be seen before next refill)  Dispense: 30 tablet; Refill: 0 - CMP14+EGFR - CBC with Differential/Platelet - amLODipine (NORVASC) 5 MG tablet; Take 1 tablet (5 mg total) by mouth daily.  Dispense: 90 tablet; Refill: 3  3. Type 2 diabetes mellitus without complication, without long-term current use of insulin (HCC) - sitaGLIPtin (JANUVIA) 100 MG tablet; Take 1 tablet (100 mg total) by mouth daily. (Needs to be seen before next refill)  Dispense: 90 tablet; Refill: 2 - metFORMIN (GLUCOPHAGE) 500 MG tablet; Take 1 tablet (500 mg total) by mouth daily with breakfast. (Needs to be seen before next refill)  Dispense: 180 tablet; Refill: 2 - empagliflozin (JARDIANCE) 10 MG TABS tablet; Take 1 tablet (10 mg total) by mouth daily. (Needs to be seen before next refill)  Dispense: 90 tablet; Refill: 1 - Bayer DCA Hb A1c Waived - CMP14+EGFR - CBC with Differential/Platelet  4. Development delay - CMP14+EGFR - CBC with Differential/Platelet  5. Colon cancer screening - Cologuard  6. Bilateral impacted cerumen   Labs pending Health Maintenance reviewed Diet and exercise encouraged  Follow up plan: 2 weeks to recheck HTN   Evelina Dun, FNP

## 2020-09-28 NOTE — Patient Instructions (Signed)

## 2020-09-29 LAB — CBC WITH DIFFERENTIAL/PLATELET
Basophils Absolute: 0.1 10*3/uL (ref 0.0–0.2)
Basos: 1 %
EOS (ABSOLUTE): 0.1 10*3/uL (ref 0.0–0.4)
Eos: 1 %
Hematocrit: 41.8 % (ref 34.0–46.6)
Hemoglobin: 13.5 g/dL (ref 11.1–15.9)
Immature Grans (Abs): 0 10*3/uL (ref 0.0–0.1)
Immature Granulocytes: 0 %
Lymphocytes Absolute: 2.5 10*3/uL (ref 0.7–3.1)
Lymphs: 44 %
MCH: 27.7 pg (ref 26.6–33.0)
MCHC: 32.3 g/dL (ref 31.5–35.7)
MCV: 86 fL (ref 79–97)
Monocytes Absolute: 0.4 10*3/uL (ref 0.1–0.9)
Monocytes: 6 %
Neutrophils Absolute: 2.7 10*3/uL (ref 1.4–7.0)
Neutrophils: 48 %
Platelets: 358 10*3/uL (ref 150–450)
RBC: 4.87 x10E6/uL (ref 3.77–5.28)
RDW: 14.8 % (ref 11.7–15.4)
WBC: 5.7 10*3/uL (ref 3.4–10.8)

## 2020-09-29 LAB — CMP14+EGFR
ALT: 18 IU/L (ref 0–32)
AST: 11 IU/L (ref 0–40)
Albumin/Globulin Ratio: 1.7 (ref 1.2–2.2)
Albumin: 4.9 g/dL — ABNORMAL HIGH (ref 3.8–4.8)
Alkaline Phosphatase: 65 IU/L (ref 44–121)
BUN/Creatinine Ratio: 15 (ref 12–28)
BUN: 10 mg/dL (ref 8–27)
Bilirubin Total: 0.4 mg/dL (ref 0.0–1.2)
CO2: 21 mmol/L (ref 20–29)
Calcium: 10.4 mg/dL — ABNORMAL HIGH (ref 8.7–10.3)
Chloride: 103 mmol/L (ref 96–106)
Creatinine, Ser: 0.68 mg/dL (ref 0.57–1.00)
Globulin, Total: 2.9 g/dL (ref 1.5–4.5)
Glucose: 106 mg/dL — ABNORMAL HIGH (ref 65–99)
Potassium: 4.2 mmol/L (ref 3.5–5.2)
Sodium: 143 mmol/L (ref 134–144)
Total Protein: 7.8 g/dL (ref 6.0–8.5)
eGFR: 98 mL/min/{1.73_m2} (ref >59)

## 2020-09-29 LAB — LIPID PANEL
Chol/HDL Ratio: 3.8 ratio (ref 0.0–4.4)
Cholesterol, Total: 169 mg/dL (ref 100–199)
HDL: 45 mg/dL (ref >39)
LDL Chol Calc (NIH): 111 mg/dL — ABNORMAL HIGH (ref 0–99)
Triglycerides: 69 mg/dL (ref 0–149)
VLDL Cholesterol Cal: 13 mg/dL (ref 5–40)

## 2020-10-30 ENCOUNTER — Encounter: Payer: Self-pay | Admitting: Family

## 2020-10-30 ENCOUNTER — Ambulatory Visit (INDEPENDENT_AMBULATORY_CARE_PROVIDER_SITE_OTHER): Payer: Medicare Other | Admitting: Family

## 2020-10-30 VITALS — BP 150/78

## 2020-10-30 DIAGNOSIS — I152 Hypertension secondary to endocrine disorders: Secondary | ICD-10-CM

## 2020-10-30 DIAGNOSIS — E1159 Type 2 diabetes mellitus with other circulatory complications: Secondary | ICD-10-CM | POA: Diagnosis not present

## 2020-10-30 MED ORDER — AMLODIPINE BESYLATE 10 MG PO TABS: 10.0000 mg | ORAL_TABLET | Freq: Every day | ORAL | 3 refills | Status: DC

## 2020-10-30 NOTE — Progress Notes (Signed)
   Virtual Visit  Note Due to COVID-19 pandemic this visit was conducted virtually. This visit type was conducted due to national recommendations for restrictions regarding the COVID-19 Pandemic (e.g. social distancing, sheltering in place) in an effort to limit this patient's exposure and mitigate transmission in our community. All issues noted in this document were discussed and addressed.  A physical exam was not performed with this format.  I connected with Shelley Wood sister on 10/30/20 at 11:21 AM  by telephone and verified that I am speaking with the correct person using two identifiers. Shelley Wood is currently located at home and sister and care giver is currently with her during visit. The provider, Jannifer Rodney, FNP is located in their office at time of visit.  I discussed the limitations, risks, security and privacy concerns of performing an evaluation and management service by telephone and the availability of in person appointments. I also discussed with the patient that there may be a patient responsible charge related to this service. The patient expressed understanding and agreed to proceed.   History and Present Illness:  Pt calls today for recheck HTN. Pt was seen on 09/28/20 and found to have elevated blood pressure. We started her on Norvasc 5 mg. Her BP at home is 150/78.  Hypertension This is a chronic problem. The current episode started more than 1 year ago. The problem has been resolved since onset. The problem is controlled. Pertinent negatives include no malaise/fatigue, peripheral edema or shortness of breath. Past treatments include calcium channel blockers. The current treatment provides moderate improvement. There is no history of CVA or heart failure.        Review of Systems  Constitutional: Negative for malaise/fatigue.  Respiratory: Negative for shortness of breath.      Observations/Objective: Caregiver and sister did most of the  talking  Assessment and Plan: 1. Hypertension associated with diabetes (HCC) Will increase Norvasc to 10 mg from 5 mg Continue Benicar 40 mg daily -Dash diet information given -Exercise encouraged - Stress Management  -Continue current meds -RTO in 2 weeks  - amLODipine (NORVASC) 10 MG tablet; Take 1 tablet (10 mg total) by mouth daily.  Dispense: 90 tablet; Refill: 3    I discussed the assessment and treatment plan with the patient. The patient was provided an opportunity to ask questions and all were answered. The patient agreed with the plan and demonstrated an understanding of the instructions.   The patient was advised to call back or seek an in-person evaluation if the symptoms worsen or if the condition fails to improve as anticipated.  The above assessment and management plan was discussed with the patient. The patient verbalized understanding of and has agreed to the management plan. Patient is aware to call the clinic if symptoms persist or worsen. Patient is aware when to return to the clinic for a follow-up visit. Patient educated on when it is appropriate to go to the emergency department.   Time call ended:  11:32 AM   I provided 11 minutes of  non face-to-face time during this encounter.    Jannifer Rodney, FNP

## 2020-11-14 ENCOUNTER — Encounter: Payer: Self-pay | Admitting: Family

## 2020-11-14 ENCOUNTER — Ambulatory Visit (INDEPENDENT_AMBULATORY_CARE_PROVIDER_SITE_OTHER): Payer: Medicare Other | Admitting: Family

## 2020-11-14 ENCOUNTER — Other Ambulatory Visit: Payer: Self-pay

## 2020-11-14 VITALS — BP 115/61 | HR 85 | Temp 97.6°F | Ht <= 58 in | Wt 119.2 lb

## 2020-11-14 DIAGNOSIS — I152 Hypertension secondary to endocrine disorders: Secondary | ICD-10-CM | POA: Diagnosis not present

## 2020-11-14 DIAGNOSIS — E1159 Type 2 diabetes mellitus with other circulatory complications: Secondary | ICD-10-CM

## 2020-11-14 NOTE — Progress Notes (Signed)
   Subjective:    Patient ID: Shelley Wood, female    DOB: 04-15-1956, 65 y.o.   MRN: [id number]  Chief Complaint  Patient presents with  . Hypertension   Pt presents to the office today to recheck HTN.  Hypertension This is a chronic problem. The current episode started more than 1 year ago. The problem has been resolved since onset. The problem is controlled. Pertinent negatives include no malaise/fatigue, peripheral edema or shortness of breath. Risk factors for coronary artery disease include dyslipidemia. The current treatment provides moderate improvement. There is no history of CVA or heart failure.      Review of Systems  Constitutional: Negative for malaise/fatigue.  Respiratory: Negative for shortness of breath.   All other systems reviewed and are negative.      Objective:   Physical Exam Vitals reviewed.  Constitutional:      General: She is not in acute distress.    Appearance: She is well-developed.  HENT:     Head: Normocephalic and atraumatic.     Right Ear: Tympanic membrane normal.     Left Ear: Tympanic membrane normal.  Eyes:     Pupils: Pupils are equal, round, and reactive to light.  Neck:     Thyroid: No thyromegaly.  Cardiovascular:     Rate and Rhythm: Normal rate and regular rhythm.     Heart sounds: Normal heart sounds. No murmur heard.   Pulmonary:     Effort: Pulmonary effort is normal. No respiratory distress.     Breath sounds: Normal breath sounds. No wheezing.  Abdominal:     General: Bowel sounds are normal. There is no distension.     Palpations: Abdomen is soft.     Tenderness: There is no abdominal tenderness.  Musculoskeletal:        General: No tenderness. Normal range of motion.     Cervical back: Normal range of motion and neck supple.  Skin:    General: Skin is warm and dry.  Neurological:     Mental Status: She is alert and oriented to person, place, and time.     Cranial Nerves: No cranial nerve deficit.     Deep  Tendon Reflexes: Reflexes are normal and symmetric.  Psychiatric:        Behavior: Behavior normal.        Thought Content: Thought content normal.        Judgment: Judgment normal.       BP 115/61   Pulse 85   Temp 97.6 F (36.4 C) (Temporal)   Ht _0  (1.397 m)   Wt 119 lb 3.2 oz (54.1 kg)   BMI 27.70 kg/m      Assessment & Plan:  Shelley Wood comes in today with chief complaint of Hypertension   Diagnosis and orders addressed:  1. Hypertension associated with diabetes ([place]) -Dash diet information given -Exercise encouraged - Stress Management  -Continue current meds -RTO in 6 months  - BMP8+EGFR   Evelina Dun, FNP

## 2020-11-14 NOTE — Patient Instructions (Signed)

## 2020-11-15 LAB — BMP8+EGFR
BUN/Creatinine Ratio: 19 (ref 12–28)
BUN: 17 mg/dL (ref 8–27)
CO2: 23 mmol/L (ref 20–29)
Calcium: 9.9 mg/dL (ref 8.7–10.3)
Chloride: 103 mmol/L (ref 96–106)
Creatinine, Ser: 0.91 mg/dL (ref 0.57–1.00)
Glucose: 125 mg/dL — ABNORMAL HIGH (ref 65–99)
Potassium: 4.4 mmol/L (ref 3.5–5.2)
Sodium: 142 mmol/L (ref 134–144)
eGFR: 71 mL/min/{1.73_m2} (ref >59)

## 2020-11-16 ENCOUNTER — Other Ambulatory Visit: Payer: Self-pay | Admitting: Family

## 2020-11-16 DIAGNOSIS — I152 Hypertension secondary to endocrine disorders: Secondary | ICD-10-CM

## 2020-11-16 DIAGNOSIS — E785 Hyperlipidemia, unspecified: Secondary | ICD-10-CM

## 2020-11-26 ENCOUNTER — Encounter: Payer: Self-pay | Admitting: Family Medicine

## 2020-11-28 ENCOUNTER — Encounter: Payer: Self-pay | Admitting: Family Medicine

## 2020-12-07 ENCOUNTER — Ambulatory Visit (INDEPENDENT_AMBULATORY_CARE_PROVIDER_SITE_OTHER): Payer: Medicare Other

## 2020-12-07 VITALS — Ht <= 58 in | Wt 119.0 lb

## 2020-12-07 DIAGNOSIS — Z0000 Encounter for general adult medical examination without abnormal findings: Secondary | ICD-10-CM | POA: Diagnosis not present

## 2020-12-07 DIAGNOSIS — Z1231 Encounter for screening mammogram for malignant neoplasm of breast: Secondary | ICD-10-CM | POA: Diagnosis not present

## 2020-12-07 NOTE — Progress Notes (Signed)
Subjective:   Shelley Wood is a 65 y.o. female who presents for Medicare Annual (Subsequent) preventive examination.  Virtual Visit via Telephone Note  I connected with  Shelley Wood on 12/07/20 at  3:30 PM EDT by telephone and verified that I am speaking with the correct person using two identifiers.  Location: Patient: Home Provider: [facility] Persons participating in the virtual visit: patient/Nurse Health Advisor   I discussed the limitations, risks, security and privacy concerns of performing an evaluation and management service by telephone and the availability of in person appointments. The patient expressed understanding and agreed to proceed.  Interactive audio and video telecommunications were attempted between this nurse and patient, however failed, due to patient having technical difficulties OR patient did not have access to video capability.  We continued and completed visit with audio only.  Some vital signs may be absent or patient reported.   Lexany Belknap E Brigido Mera, LPN   Review of Systems     Cardiac Risk Factors include: diabetes mellitus;dyslipidemia;hypertension     Objective:    Today's Vitals   12/07/20 1320  Weight: 119 lb (54 kg)  Height: $Remove'4\' 7"'JWGGtWK$  (1.397 m)   Body mass index is 27.66 kg/m.  Advanced Directives 12/07/2020 12/31/2018  Does Patient Have a Medical Advance Directive? Yes No  Type of Advance Directive [place] in Chart? No - copy requested -  Would patient like information on creating a medical advance directive? - Yes (MAU/Ambulatory/Procedural Areas - Information given)    Current Medications (verified) Outpatient Encounter Medications as of 12/07/2020  Medication Sig  . amLODipine (NORVASC) 10 MG tablet Take 1 tablet (10 mg total) by mouth daily.  . blood glucose meter kit and supplies Dispense based on patient and insurance preference. Use up to four times daily as directed. (FOR  ICD-10 E10.9, E11.9).  Marland Kitchen Blood Glucose Monitoring Suppl (ONE TOUCH ULTRA 2) w/Device KIT   . empagliflozin (JARDIANCE) 10 MG TABS tablet Take 1 tablet (10 mg total) by mouth daily. (Needs to be seen before next refill)  . glucose blood (ACCU-CHEK GUIDE) test strip Check BS up to 4 times daily Dx E11.9  . metFORMIN (GLUCOPHAGE) 500 MG tablet Take 1 tablet (500 mg total) by mouth daily with breakfast. (Needs to be seen before next refill)  . olmesartan (BENICAR) 40 MG tablet TAKE ONE TABLET BY MOUTH EVERY DAY  . omeprazole (PRILOSEC) 20 MG capsule Take 1 capsule (20 mg total) by mouth daily. (Needs to be seen before next refill)  . ONETOUCH DELICA LANCETS 40G MISC   . pravastatin (PRAVACHOL) 40 MG tablet TAKE ONE TABLET BY MOUTH EVERY DAY  . sitaGLIPtin (JANUVIA) 100 MG tablet Take 1 tablet (100 mg total) by mouth daily. (Needs to be seen before next refill)   No facility-administered encounter medications on file as of 12/07/2020.    Allergies (verified) Patient has no known allergies.   History: Past Medical History:  Diagnosis Date  . Allergy   . Cataract   . Depression   . Diabetes mellitus without complication ([place])   . Emphysema of lung ([place])   . GERD (gastroesophageal reflux disease)   . Heart murmur   . Hyperlipidemia   . Hypertension    History reviewed. No pertinent surgical history. Family History  Problem Relation Age of Onset  . Arthritis Mother   . Heart disease Mother   . Hypertension Mother   . Stroke Mother   .  Alcohol abuse Father   . Arthritis Father   . Early death Father   . Heart disease Father   . Arthritis Sister   . Birth defects Sister   . Cancer Sister   . COPD Sister   . Hyperlipidemia Sister   . Hypertension Sister   . Arthritis Brother   . Hyperlipidemia Brother   . Hypertension Brother   . Arthritis Maternal Grandmother   . Arthritis Maternal Grandfather   . Arthritis Brother   . Arthritis Sister   . Cancer Sister   . Arthritis Sister    . Learning disabilities Sister   . Mental retardation Sister   . Arthritis Sister   . Arthritis Sister    Social History   Socioeconomic History  . Marital status: Single    Spouse name: Not on file  . Number of children: Not on file  . Years of education: Not on file  . Highest education level: Not on file  Occupational History  . Occupation: Disabled  Tobacco Use  . Smoking status: Never Smoker  . Smokeless tobacco: Never Used  Vaping Use  . Vaping Use: Never used  Substance and Sexual Activity  . Alcohol use: No  . Drug use: No  . Sexual activity: Never  Other Topics Concern  . Not on file  Social History Narrative   She is mentally challenged; Lives with her sister, has a caretaker weekdays 8-5   Social Determinants of Health   Financial Resource Strain: Low Risk   . Difficulty of Paying Living Expenses: Not hard at all  Food Insecurity: No Food Insecurity  . Worried About Charity fundraiser in the Last Year: Never true  . Ran Out of Food in the Last Year: Never true  Transportation Needs: No Transportation Needs  . Lack of Transportation (Medical): No  . Lack of Transportation (Non-Medical): No  Physical Activity: Sufficiently Active  . Days of Exercise per Week: 7 days  . Minutes of Exercise per Session: 30 min  Stress: No Stress Concern Present  . Feeling of Stress : Only a little  Social Connections: Moderately Integrated  . Frequency of Communication with Friends and Family: More than three times a week  . Frequency of Social Gatherings with Friends and Family: More than three times a week  . Attends Religious Services: More than 4 times per year  . Active Member of Clubs or Organizations: Yes  . Attends Archivist Meetings: More than 4 times per year  . Marital Status: Never married    Tobacco Counseling Counseling given: Not Answered   Clinical Intake:  Pre-visit preparation completed: Yes  Pain : No/denies pain     BMI -  recorded: 27.66 Nutritional Status: BMI 25 -29 Overweight Nutritional Risks: None Diabetes: Yes CBG done?: No Did pt. bring in CBG monitor from home?: No  How often do you need to have someone help you when you read instructions, pamphlets, or other written materials from your doctor or pharmacy?: 5 - Always  Nutrition Risk Assessment:  Has the patient had any N/V/D within the last 2 months?  No  Does the patient have any non-healing wounds?  No  Has the patient had any unintentional weight loss or weight gain?  No   Diabetes:  Is the patient diabetic?  Yes  If diabetic, was a CBG obtained today?  No  Did the patient bring in their glucometer from home?  No  How often do you monitor your  CBG's? 2-3 times per week.   Financial Strains and Diabetes Management:  Are you having any financial strains with the device, your supplies or your medication? No .  Does the patient want to be seen by Chronic Care Management for management of their diabetes?  No  Would the patient like to be referred to a Nutritionist or for Diabetic Management?  No   Diabetic Exams:  Diabetic Eye Exam: Completed 07/2019.  Diabetic Foot Exam: Completed 09/28/20. Pt has been advised about the importance in completing this exam. Pt is scheduled for diabetic foot exam on next year.    Interpreter Needed?: No  Information entered by :: Kyliegh Jester, LPN   Activities of Daily Living In your present state of health, do you have any difficulty performing the following activities: 12/07/2020  Hearing? N  Vision? N  Difficulty concentrating or making decisions? Y  Walking or climbing stairs? N  Dressing or bathing? N  Doing errands, shopping? Y  Preparing Food and eating ? Y  Using the Toilet? N  In the past six months, have you accidently leaked urine? N  Do you have problems with loss of bowel control? N  Managing your Medications? Y  Managing your Finances? Y  Housekeeping or managing your Housekeeping?  Y  Some recent data might be hidden    Patient Care Team: Sharion Balloon, FNP as PCP - General (Family Medicine) Harlen Labs, MD as Referring Physician (Optometry)  Indicate any recent Medical Services you may have received from other than [facility] providers in the past year (date may be approximate).     Assessment:   This is a routine wellness examination for Deziyah.  Hearing/Vision screen  Hearing Screening   '125Hz'$  $[place]'250Hz'[place]$'500Hz'$'1000Hz'$'2000Hz'$'3000Hz'$'4000Hz'$'6000Hz'$'8000Hz'$   Right ear:           Left ear:           Comments: Denies hearing difficulties   Vision Screening Comments: Wears eyeglasses - Annual visits with Dr Marin Comment in [place]  Dietary issues and exercise activities discussed: Current Exercise Habits: Home exercise routine, Type of exercise: stretching;walking;Other - see comments (aerobics videos with her caretaker), Time (Minutes): 30, Frequency (Times/Week): 5, Weekly Exercise (Minutes/Week): 150, Intensity: Moderate, Exercise limited by: None identified  Goals Addressed            This Visit's Progress   . Exercise 3x per week (30 min per time)   On track    Try to exercise for at least 30 minutes 3 times weekly.       Depression Screen PHQ 2/9 Scores 12/07/2020 11/14/2020 09/28/2020 12/31/2018 12/15/2018 07/20/2018 06/15/2018  PHQ - 2 Score 0 0 0 0 0 0 0  PHQ- 9 Score - - 0 - - - -    Fall Risk Fall Risk  12/07/2020 11/14/2020 12/31/2018 07/20/2018 06/15/2018  Falls in the past year? 0 0 0 0 0  Number falls in past yr: 0 - 0 - -  Injury with Fall? 0 - 0 - -  Risk for fall due to : No Fall Risks - - - -  Follow up Falls prevention discussed - - - -    FALL RISK PREVENTION PERTAINING TO THE HOME:  Any stairs in or around the home? Yes  If so, are there any without handrails? No  Home free of loose throw rugs in walkways, pet beds, electrical cords, etc? Yes  Adequate lighting in your home to reduce risk  of falls? Yes   ASSISTIVE DEVICES UTILIZED TO PREVENT  FALLS:  Life alert? No  Use of a cane, walker or w/c? No  Grab bars in the bathroom? Yes  Shower chair or bench in shower? Yes  Elevated toilet seat or a handicapped toilet? No   TIMED UP AND GO:  Was the test performed? No . Telephonic visit  Cognitive Function: Cognitive status assessed by direct observation. Patient is mentally challenged. Patient is unable to complete screening 6CIT or MMSE.      6CIT Screen 12/31/2018  What Year? 0 points  What month? 0 points  What time? 0 points  Count back from 20 2 points  Months in reverse 4 points  Repeat phrase 4 points  Total Score 10    Immunizations Immunization History  Administered Date(s) Administered  . Influenza,inj,Quad PF,6+ Mos 06/09/2017, 06/15/2018, 09/15/2019  . Moderna Sars-Covid-2 Vaccination 09/28/2019, 10/26/2019, 06/19/2020  . Pneumococcal Polysaccharide-23 09/28/2020  . Tdap 09/28/2020    TDAP status: Up to date  Flu Vaccine status: Up to date  Pneumococcal vaccine status: Up to date  Covid-19 vaccine status: Completed vaccines  Qualifies for Shingles Vaccine? Yes   Zostavax completed No   Shingrix Completed?: No.    Education has been provided regarding the importance of this vaccine. Patient has been advised to call insurance company to determine out of pocket expense if they have not yet received this vaccine. Advised may also receive vaccine at local pharmacy or Health Dept. Verbalized acceptance and understanding.  Screening Tests Health Maintenance  Topic Date Due  . OPHTHALMOLOGY EXAM  Never done  . Fecal DNA (Cologuard)  Never done  . Zoster Vaccines- Shingrix (1 of 2) Never done  . COVID-19 Vaccine (4 - Booster for Moderna series) 09/17/2020  . HIV Screening  12/28/2020 (Originally 01/25/1971)  . MAMMOGRAM  09/28/2021 (Originally 08/12/2019)  . INFLUENZA VACCINE  02/11/2021  . HEMOGLOBIN A1C  03/31/2021  . FOOT EXAM  09/28/2021  . TETANUS/TDAP  09/29/2030  . PNEUMOCOCCAL POLYSACCHARIDE  VACCINE AGE 63-64 HIGH RISK  Completed  . Hepatitis C Screening  Completed  . HPV VACCINES  Aged Out    Health Maintenance  Health Maintenance Due  Topic Date Due  . OPHTHALMOLOGY EXAM  Never done  . Fecal DNA (Cologuard)  Never done  . Zoster Vaccines- Shingrix (1 of 2) Never done  . COVID-19 Vaccine (4 - Booster for Moderna series) 09/17/2020    Colorectal cancer screening: Type of screening: Cologuard. Completed 09/2020. Repeat every 3 years  Mammogram status: Ordered 12/07/20. Pt provided with contact info and advised to call to schedule appt.   Bone Density status: Due at age 89  Lung Cancer Screening: (Low Dose CT Chest recommended if Age 73-80 years, 30 pack-year currently smoking OR have quit w/in 15years.) does not qualify.   Additional Screening:  Hepatitis C Screening: does qualify; Completed 01/10/2020  Vision Screening: Recommended annual ophthalmology exams for early detection of glaucoma and other disorders of the eye. Is the patient up to date with their annual eye exam?  Yes  Who is the provider or what is the name of the office in which the patient attends annual eye exams? Dr Marin Comment in [place] If pt is not established with a provider, would they like to be referred to a provider to establish care? No .   Dental Screening: Recommended annual dental exams for proper oral hygiene  Community Resource Referral / Chronic Care Management: CRR required this visit?  No   CCM required this visit?  No      Plan:     I have personally reviewed and noted the following in the patient's chart:   . Medical and social history . Use of alcohol, tobacco or illicit drugs  . Current medications and supplements including opioid prescriptions.  . Functional ability and status . Nutritional status . Physical activity . Advanced directives . List of other physicians . Hospitalizations, surgeries, and ER visits in previous 12 months . Vitals . Screenings to include cognitive,  depression, and falls . Referrals and appointments  In addition, I have reviewed and discussed with patient certain preventive protocols, quality metrics, and best practice recommendations. A written personalized care plan for preventive services as well as general preventive health recommendations were provided to patient.     Sandrea Hammond, LPN   [id number]   Nurse Notes: None

## 2020-12-07 NOTE — Patient Instructions (Signed)
[name] , Thank you for taking time to come for your Medicare Wellness Visit. I appreciate your ongoing commitment to your health goals. Please review the following plan we discussed and let me know if I can assist you in the future.   Screening recommendations/referrals: Colonoscopy: Due (return cologuard that was ordered in March  Mammogram: Ordered repeat today Bone Density: Due at age 65 Recommended yearly ophthalmology/optometry visit for glaucoma screening and checkup Recommended yearly dental visit for hygiene and checkup  Vaccinations: Influenza vaccine: Done 09/15/2019 - Repeat fall 2022 Pneumococcal vaccine: Pneumovax done 09/28/2020; Get Prevnar in a year Tdap vaccine: Done 09/28/2020 - Repeat in 10 years Shingles vaccine: Due Shingrix discussed. Please contact your pharmacy for coverage information.   Covid-19: Done 09/28/19, 10/26/19, & 06/19/2020  Advanced directives: Please bring a copy of your health care power of attorney and living will to the office to be added to your chart at your convenience.  Conditions/risks identified: Aim for 30 minutes of exercise or brisk walking each day, drink 6-8 glasses of water and eat lots of fruits and vegetables.  Next appointment: Follow up in one year for your annual wellness visit.   Preventive Care 40-64 Years, Female Preventive care refers to lifestyle choices and visits with your health care provider that can promote health and wellness. What does preventive care include?  A yearly physical exam. This is also called an annual well check.  Dental exams once or twice a year.  Routine eye exams. Ask your health care provider how often you should have your eyes checked.  Personal lifestyle choices, including:  Daily care of your teeth and gums.  Regular physical activity.  Eating a healthy diet.  Avoiding tobacco and drug use.  Limiting alcohol use.  Practicing safe sex.  Taking low-dose aspirin daily starting at age  17.  Taking vitamin and mineral supplements as recommended by your health care provider. What happens during an annual well check? The services and screenings done by your health care provider during your annual well check will depend on your age, overall health, lifestyle risk factors, and family history of disease. Counseling  Your health care provider may ask you questions about your:  Alcohol use.  Tobacco use.  Drug use.  Emotional well-being.  Home and relationship well-being.  Sexual activity.  Eating habits.  Work and work Statistician.  Method of birth control.  Menstrual cycle.  Pregnancy history. Screening  You may have the following tests or measurements:  Height, weight, and BMI.  Blood pressure.  Lipid and cholesterol levels. These may be checked every 5 years, or more frequently if you are over 70 years old.  Skin check.  Lung cancer screening. You may have this screening every year starting at age 14 if you have a 30-pack-year history of smoking and currently smoke or have quit within the past 15 years.  Fecal occult blood test (FOBT) of the stool. You may have this test every year starting at age 83.  Flexible sigmoidoscopy or colonoscopy. You may have a sigmoidoscopy every 5 years or a colonoscopy every 10 years starting at age 10.  Hepatitis C blood test.  Hepatitis B blood test.  Sexually transmitted disease (STD) testing.  Diabetes screening. This is done by checking your blood sugar (glucose) after you have not eaten for a while (fasting). You may have this done every 1-3 years.  Mammogram. This may be done every 1-2 years. Talk to your health care provider about when you  should start having regular mammograms. This may depend on whether you have a family history of breast cancer.  BRCA-related cancer screening. This may be done if you have a family history of breast, ovarian, tubal, or peritoneal cancers.  Pelvic exam and Pap test. This  may be done every 3 years starting at age 27. Starting at age 35, this may be done every 5 years if you have a Pap test in combination with an HPV test.  Bone density scan. This is done to screen for osteoporosis. You may have this scan if you are at high risk for osteoporosis. Discuss your test results, treatment options, and if necessary, the need for more tests with your health care provider. Vaccines  Your health care provider may recommend certain vaccines, such as:  Influenza vaccine. This is recommended every year.  Tetanus, diphtheria, and acellular pertussis (Tdap, Td) vaccine. You may need a Td booster every 10 years.  Zoster vaccine. You may need this after age 62.  Pneumococcal 13-valent conjugate (PCV13) vaccine. You may need this if you have certain conditions and were not previously vaccinated.  Pneumococcal polysaccharide (PPSV23) vaccine. You may need one or two doses if you smoke cigarettes or if you have certain conditions. Talk to your health care provider about which screenings and vaccines you need and how often you need them. This information is not intended to replace advice given to you by your health care provider. Make sure you discuss any questions you have with your health care provider. Document Released: 07/27/2015 Document Revised: 03/19/2016 Document Reviewed: 05/01/2015 Elsevier Interactive Patient Education  2017 [place] Prevention in the Home Falls can cause injuries. They can happen to people of all ages. There are many things you can do to make your home safe and to help prevent falls. What can I do on the outside of my home?  Regularly fix the edges of walkways and driveways and fix any cracks.  Remove anything that might make you trip as you walk through a door, such as a raised step or threshold.  Trim any bushes or trees on the path to your home.  Use bright outdoor lighting.  Clear any walking paths of anything that might  make someone trip, such as rocks or tools.  Regularly check to see if handrails are loose or broken. Make sure that both sides of any steps have handrails.  Any raised decks and porches should have guardrails on the edges.  Have any leaves, snow, or ice cleared regularly.  Use sand or salt on walking paths during winter.  Clean up any spills in your garage right away. This includes oil or grease spills. What can I do in the bathroom?  Use night lights.  Install grab bars by the toilet and in the tub and shower. Do not use towel bars as grab bars.  Use non-skid mats or decals in the tub or shower.  If you need to sit down in the shower, use a plastic, non-slip stool.  Keep the floor dry. Clean up any water that spills on the floor as soon as it happens.  Remove soap buildup in the tub or shower regularly.  Attach bath mats securely with double-sided non-slip rug tape.  Do not have throw rugs and other things on the floor that can make you trip. What can I do in the bedroom?  Use night lights.  Make sure that you have a light by your bed that is  easy to reach.  Do not use any sheets or blankets that are too big for your bed. They should not hang down onto the floor.  Have a firm chair that has side arms. You can use this for support while you get dressed.  Do not have throw rugs and other things on the floor that can make you trip. What can I do in the kitchen?  Clean up any spills right away.  Avoid walking on wet floors.  Keep items that you use a lot in easy-to-reach places.  If you need to reach something above you, use a strong step stool that has a grab bar.  Keep electrical cords out of the way.  Do not use floor polish or wax that makes floors slippery. If you must use wax, use non-skid floor wax.  Do not have throw rugs and other things on the floor that can make you trip. What can I do with my stairs?  Do not leave any items on the stairs.  Make sure  that there are handrails on both sides of the stairs and use them. Fix handrails that are broken or loose. Make sure that handrails are as long as the stairways.  Check any carpeting to make sure that it is firmly attached to the stairs. Fix any carpet that is loose or worn.  Avoid having throw rugs at the top or bottom of the stairs. If you do have throw rugs, attach them to the floor with carpet tape.  Make sure that you have a light switch at the top of the stairs and the bottom of the stairs. If you do not have them, ask someone to add them for you. What else can I do to help prevent falls?  Wear shoes that:  Do not have high heels.  Have rubber bottoms.  Are comfortable and fit you well.  Are closed at the toe. Do not wear sandals.  If you use a stepladder:  Make sure that it is fully opened. Do not climb a closed stepladder.  Make sure that both sides of the stepladder are locked into place.  Ask someone to hold it for you, if possible.  Clearly mark and make sure that you can see:  Any grab bars or handrails.  First and last steps.  Where the edge of each step is.  Use tools that help you move around (mobility aids) if they are needed. These include:  Canes.  Walkers.  Scooters.  Crutches.  Turn on the lights when you go into a dark area. Replace any light bulbs as soon as they burn out.  Set up your furniture so you have a clear path. Avoid moving your furniture around.  If any of your floors are uneven, fix them.  If there are any pets around you, be aware of where they are.  Review your medicines with your doctor. Some medicines can make you feel dizzy. This can increase your chance of falling. Ask your doctor what other things that you can do to help prevent falls. This information is not intended to replace advice given to you by your health care provider. Make sure you discuss any questions you have with your health care provider. Document  Released: 04/26/2009 Document Revised: 12/06/2015 Document Reviewed: 08/04/2014 Elsevier Interactive Patient Education  2017 [employer].

## 2021-01-21 ENCOUNTER — Ambulatory Visit
Admission: RE | Admit: 2021-01-21 | Discharge: 2021-01-21 | Disposition: A | Discharge status: Home / Self Care | Payer: Medicare Other | Source: Ambulatory Visit | Attending: Family | Admitting: Family

## 2021-01-21 ENCOUNTER — Other Ambulatory Visit: Payer: Self-pay

## 2021-01-21 DIAGNOSIS — Z1231 Encounter for screening mammogram for malignant neoplasm of breast: Secondary | ICD-10-CM

## 2021-03-26 ENCOUNTER — Ambulatory Visit (INDEPENDENT_AMBULATORY_CARE_PROVIDER_SITE_OTHER): Payer: Medicare Other | Admitting: Family Medicine

## 2021-03-26 ENCOUNTER — Encounter: Payer: Self-pay | Admitting: Family Medicine

## 2021-03-26 VITALS — BP 160/72 | HR 83 | Temp 97.6°F

## 2021-03-26 DIAGNOSIS — U071 COVID-19: Secondary | ICD-10-CM | POA: Diagnosis not present

## 2021-03-27 ENCOUNTER — Encounter: Payer: Self-pay | Admitting: Family Medicine

## 2021-03-27 NOTE — Progress Notes (Signed)
.  Subjective:  Patient ID: Shelley Wood, female    DOB: Mar 29, 1956  Age: 65 y.o. MRN: [id number]  CC: Cough, Nasal Congestion, sneezing, and Covid Positive   HPI Shelley Wood presents for Patient presents with upper respiratory congestion. Rhinorrhea that is clear. There is moderate sore throat. Patient reports coughing frequently as well.  No  sputum noted. There is no fever, chills or sweats. The patient denies being short of breath. Onset was 2 days ago. Gradually worsening.   Depression screen [facility] 2/9 12/07/2020 11/14/2020 09/28/2020  Decreased Interest 0 0 0  Down, Depressed, Hopeless 0 0 0  PHQ - 2 Score 0 0 0  Altered sleeping - - 0  Tired, decreased energy - - 0  Change in appetite - - 0  Feeling bad or failure about yourself  - - 0  Trouble concentrating - - 0  Moving slowly or fidgety/restless - - 0  Suicidal thoughts - - 0  PHQ-9 Score - - 0  Difficult doing work/chores - - Not difficult at all    History Shelley Wood has a past medical history of Allergy, Cataract, Depression, Diabetes mellitus without complication ([place]), Emphysema of lung ([place]), GERD (gastroesophageal reflux disease), Heart murmur, Hyperlipidemia, and Hypertension.   She has no past surgical history on file.   Her family history includes Alcohol abuse in her father; Arthritis in her brother, brother, father, maternal grandfather, maternal grandmother, mother, sister, sister, sister, sister, and sister; Birth defects in her sister; COPD in her sister; Cancer in her sister and sister; Early death in her father; Heart disease in her father and mother; Hyperlipidemia in her brother and sister; Hypertension in her brother, mother, and sister; Learning disabilities in her sister; Mental retardation in her sister; Stroke in her mother.She reports that she has never smoked. She has never used smokeless tobacco. She reports that she does not drink alcohol and does not use drugs.    ROS Review of Systems   Constitutional:  Negative for activity change, appetite change, chills and fever.  HENT:  Positive for congestion, postnasal drip, rhinorrhea and sinus pressure. Negative for ear discharge, ear pain, hearing loss, nosebleeds, sneezing and trouble swallowing.   Respiratory:  Negative for chest tightness and shortness of breath.   Cardiovascular:  Negative for chest pain and palpitations.  Skin:  Negative for rash.   Objective:  BP (!) 160/72   Pulse 83   Temp 97.6 F (36.4 C)   BP Readings from Last 3 Encounters:  03/26/21 (!) 160/72  11/14/20 115/61  10/30/20 (!) 150/78    Wt Readings from Last 3 Encounters:  12/07/20 119 lb (54 kg)  11/14/20 119 lb 3.2 oz (54.1 kg)  09/28/20 120 lb 9.6 oz (54.7 kg)     Physical Exam Constitutional:      General: She is not in acute distress.    Appearance: She is well-developed.  Cardiovascular:     Rate and Rhythm: Normal rate and regular rhythm.  Pulmonary:     Breath sounds: Normal breath sounds.  Musculoskeletal:        General: Normal range of motion.  Skin:    General: Skin is warm and dry.  Neurological:     Mental Status: She is alert and oriented to person, place, and time.      Assessment & Plan:   Shelley Wood was seen today for cough, nasal congestion, sneezing and covid positive.  Diagnoses and all orders for this visit:  COVID-19 virus infection  I am having Shelley Wood maintain her ONE TOUCH ULTRA 2, OneTouch Delica Lancets 82P, blood glucose meter kit and supplies, Accu-Chek Guide, sitaGLIPtin, metFORMIN, empagliflozin, omeprazole, amLODipine, olmesartan, and pravastatin.  Allergies as of 03/26/2021   No Known Allergies      Medication List        Accurate as of March 26, 2021 11:59 PM. If you have any questions, ask your nurse or doctor.          Accu-Chek Guide test strip Generic drug: glucose blood Check BS up to 4 times daily Dx E11.9   amLODipine 10 MG tablet Commonly known as:  NORVASC Take 1 tablet (10 mg total) by mouth daily.   blood glucose meter kit and supplies Dispense based on patient and insurance preference. Use up to four times daily as directed. (FOR ICD-10 E10.9, E11.9).   empagliflozin 10 MG Tabs tablet Commonly known as: Jardiance Take 1 tablet (10 mg total) by mouth daily. (Needs to be seen before next refill)   metFORMIN 500 MG tablet Commonly known as: GLUCOPHAGE Take 1 tablet (500 mg total) by mouth daily with breakfast. (Needs to be seen before next refill)   olmesartan 40 MG tablet Commonly known as: BENICAR TAKE ONE TABLET BY MOUTH EVERY DAY   omeprazole 20 MG capsule Commonly known as: PRILOSEC Take 1 capsule (20 mg total) by mouth daily. (Needs to be seen before next refill)   ONE TOUCH ULTRA 2 w/Device Kit   OneTouch Delica Lancets 18F Misc   pravastatin 40 MG tablet Commonly known as: PRAVACHOL TAKE ONE TABLET BY MOUTH EVERY DAY   sitaGLIPtin 100 MG tablet Commonly known as: Januvia Take 1 tablet (100 mg total) by mouth daily. (Needs to be seen before next refill)         Follow-up: Return if symptoms worsen or fail to improve.  Claretta Fraise, M.D.

## 2021-04-02 ENCOUNTER — Other Ambulatory Visit: Payer: Self-pay | Admitting: Family

## 2021-04-02 DIAGNOSIS — E119 Type 2 diabetes mellitus without complications: Secondary | ICD-10-CM

## 2021-05-15 ENCOUNTER — Other Ambulatory Visit: Payer: Self-pay | Admitting: Family

## 2021-05-15 DIAGNOSIS — E785 Hyperlipidemia, unspecified: Secondary | ICD-10-CM

## 2021-05-15 DIAGNOSIS — E1159 Type 2 diabetes mellitus with other circulatory complications: Secondary | ICD-10-CM

## 2021-06-13 ENCOUNTER — Other Ambulatory Visit: Payer: Self-pay | Admitting: Family

## 2021-06-13 DIAGNOSIS — I152 Hypertension secondary to endocrine disorders: Secondary | ICD-10-CM

## 2021-06-13 DIAGNOSIS — E785 Hyperlipidemia, unspecified: Secondary | ICD-10-CM

## 2021-06-13 DIAGNOSIS — E1159 Type 2 diabetes mellitus with other circulatory complications: Secondary | ICD-10-CM

## 2021-06-13 NOTE — Telephone Encounter (Signed)
[place]. NTBS 30 days given 05/15/21

## 2021-06-18 MED ORDER — OLMESARTAN MEDOXOMIL 40 MG PO TABS: 40.0000 mg | ORAL_TABLET | Freq: Every day | ORAL | 0 refills | Status: DC

## 2021-06-18 MED ORDER — PRAVASTATIN SODIUM 40 MG PO TABS: 40.0000 mg | ORAL_TABLET | Freq: Every day | ORAL | 0 refills | Status: DC

## 2021-06-18 NOTE — Addendum Note (Signed)
Addended by: Julious Payer D on: 06/18/2021 09:23 AM   Modules accepted: Orders

## 2021-06-18 NOTE — Telephone Encounter (Signed)
Pt has appt scheduled for 06/27/2021 with Neysa Bonito

## 2021-06-27 ENCOUNTER — Other Ambulatory Visit: Payer: Self-pay | Admitting: *Deleted

## 2021-06-27 ENCOUNTER — Ambulatory Visit: Payer: Medicare Other | Admitting: Family

## 2021-06-27 MED ORDER — ONETOUCH ULTRA 2 W/DEVICE KIT: PACK | 0 refills | Status: DC

## 2021-06-27 NOTE — Telephone Encounter (Signed)
From [facility] pharmacy, pt needing new glucometer, One touch Ultra 2

## 2021-07-01 ENCOUNTER — Other Ambulatory Visit: Payer: Self-pay | Admitting: Family

## 2021-07-01 DIAGNOSIS — E119 Type 2 diabetes mellitus without complications: Secondary | ICD-10-CM

## 2021-07-26 ENCOUNTER — Telehealth: Payer: Self-pay | Admitting: Family

## 2021-07-26 NOTE — Telephone Encounter (Signed)
New pharmacy documented

## 2021-08-05 ENCOUNTER — Telehealth: Payer: Self-pay | Admitting: Family

## 2021-08-05 NOTE — Telephone Encounter (Signed)
Spoke with caregiver and advised her that patient should not go without these medications.  She does have an appointment scheduled tomorrow with Alyse Low and caregiver will bring her in then.

## 2021-08-05 NOTE — Telephone Encounter (Signed)
°  Prescription Request  08/05/2021  Is this a "Controlled Substance" medicine? no  Have you seen your PCP in the last 2 weeks?  No has an apt in March with Christy. Pt has moved to [facility]. I offered an apt for tomorrow because pt ntbs before refills. Care giver said that pt took last of medication today and then asked if it was life threatening if pt had to wait till an apt for refills. Caregiver wanted dr to know that sugars are 130 to 170 in the am before breakfast and medication. Please call back  If YES, route message to pool  -  If NO, patient needs to be scheduled for appointment.  What is the name of the medication or equipment?  sitaGLIPtin (JANUVIA) 100 MG tablet pravastatin (PRAVACHOL) 40 MG tablet  Have you contacted your pharmacy to request a refill? No   Which pharmacy would you like this sent to? [facility] on [address] in [place]    Patient notified that their request is being sent to the clinical staff for review and that they should receive a response within 2 business days.   Sugar reading in the am before breakfast and medication has been 130 and 170

## 2021-08-05 NOTE — Telephone Encounter (Signed)
Caregiver called back scheduled apt for tomorrow.

## 2021-08-06 ENCOUNTER — Encounter: Payer: Self-pay | Admitting: Family

## 2021-08-06 ENCOUNTER — Ambulatory Visit (INDEPENDENT_AMBULATORY_CARE_PROVIDER_SITE_OTHER): Payer: Medicare Other | Admitting: Family

## 2021-08-06 VITALS — BP 103/55 | HR 80 | Temp 97.3°F | Ht <= 58 in | Wt 118.0 lb

## 2021-08-06 DIAGNOSIS — E785 Hyperlipidemia, unspecified: Secondary | ICD-10-CM

## 2021-08-06 DIAGNOSIS — E1169 Type 2 diabetes mellitus with other specified complication: Secondary | ICD-10-CM

## 2021-08-06 DIAGNOSIS — F840 Autistic disorder: Secondary | ICD-10-CM | POA: Diagnosis not present

## 2021-08-06 DIAGNOSIS — Z1211 Encounter for screening for malignant neoplasm of colon: Secondary | ICD-10-CM

## 2021-08-06 DIAGNOSIS — R6250 Unspecified lack of expected normal physiological development in childhood: Secondary | ICD-10-CM

## 2021-08-06 DIAGNOSIS — I152 Hypertension secondary to endocrine disorders: Secondary | ICD-10-CM

## 2021-08-06 DIAGNOSIS — Z23 Encounter for immunization: Secondary | ICD-10-CM | POA: Diagnosis not present

## 2021-08-06 DIAGNOSIS — Z111 Encounter for screening for respiratory tuberculosis: Secondary | ICD-10-CM

## 2021-08-06 DIAGNOSIS — Z114 Encounter for screening for human immunodeficiency virus [HIV]: Secondary | ICD-10-CM

## 2021-08-06 DIAGNOSIS — Z1159 Encounter for screening for other viral diseases: Secondary | ICD-10-CM | POA: Diagnosis not present

## 2021-08-06 DIAGNOSIS — E1159 Type 2 diabetes mellitus with other circulatory complications: Secondary | ICD-10-CM

## 2021-08-06 LAB — BAYER DCA HB A1C WAIVED: HB A1C (BAYER DCA - WAIVED): 6.2 % — ABNORMAL HIGH (ref 4.8–5.6)

## 2021-08-06 MED ORDER — EMPAGLIFLOZIN 10 MG PO TABS: 10.0000 mg | ORAL_TABLET | Freq: Every day | ORAL | 0 refills | Status: DC

## 2021-08-06 MED ORDER — PRAVASTATIN SODIUM 40 MG PO TABS: 40.0000 mg | ORAL_TABLET | Freq: Every day | ORAL | 0 refills | Status: DC

## 2021-08-06 MED ORDER — OLMESARTAN MEDOXOMIL 40 MG PO TABS: 40.0000 mg | ORAL_TABLET | Freq: Every day | ORAL | 0 refills | Status: DC

## 2021-08-06 MED ORDER — AMLODIPINE BESYLATE 10 MG PO TABS: 10.0000 mg | ORAL_TABLET | Freq: Every day | ORAL | 3 refills | Status: DC

## 2021-08-06 MED ORDER — SITAGLIPTIN PHOSPHATE 100 MG PO TABS: 100.0000 mg | ORAL_TABLET | Freq: Every day | ORAL | 2 refills | Status: DC

## 2021-08-06 MED ORDER — METFORMIN HCL 500 MG PO TABS: 500.0000 mg | ORAL_TABLET | Freq: Every day | ORAL | 2 refills | Status: DC

## 2021-08-06 NOTE — Patient Instructions (Signed)
Diabetes Mellitus Basics Diabetes mellitus, or diabetes, is a long-term (chronic) disease. It occurs when the body does not properly use sugar (glucose) that is released from food after you eat. Diabetes mellitus may be caused by one or both of these problems: Your pancreas does not make enough of a hormone called insulin. Your body does not react in a normal way to the insulin that it makes. Insulin lets glucose enter cells in your body. This gives you energy. If you have diabetes, glucose cannot get into cells. This causes high blood glucose (hyperglycemia). How to treat and manage diabetes You may need to take insulin or other diabetes medicines daily to keep your glucose in balance. If you are prescribed insulin, you will learn how to give yourself insulin by injection. You may need to adjust the amount of insulin you take based on the foods that you eat. You will need to check your blood glucose levels using a glucose monitor as told by your health care provider. The readings can help determine if you have low or high blood glucose. Generally, you should have these blood glucose levels: Before meals (preprandial): 80-130 mg/dL (4.4-7.2 mmol/L). After meals (postprandial): below 180 mg/dL (10 mmol/L). Hemoglobin A1c (HbA1c) level: less than 7%. Your health care provider will set treatment goals for you. Keep all follow-up visits. This is important. Follow these instructions at home: Diabetes medicines Take your diabetes medicines every day as told by your health care provider. List your diabetes medicines here: Name of medicine: ______________________________ Amount (dose): _______________ Time (a.m./p.m.): _______________ Notes: ___________________________________ Name of medicine: ______________________________ Amount (dose): _______________ Time (a.m./p.m.): _______________ Notes: ___________________________________ Name of medicine: ______________________________ Amount (dose):  _______________ Time (a.m./p.m.): _______________ Notes: ___________________________________ Insulin If you use insulin, list the types of insulin you use here: Insulin type: ______________________________ Amount (dose): _______________ Time (a.m./p.m.): _______________Notes: ___________________________________ Insulin type: ______________________________ Amount (dose): _______________ Time (a.m./p.m.): _______________ Notes: ___________________________________ Insulin type: ______________________________ Amount (dose): _______________ Time (a.m./p.m.): _______________ Notes: ___________________________________ Insulin type: ______________________________ Amount (dose): _______________ Time (a.m./p.m.): _______________ Notes: ___________________________________ Insulin type: ______________________________ Amount (dose): _______________ Time (a.m./p.m.): _______________ Notes: ___________________________________ Managing blood glucose Check your blood glucose levels using a glucose monitor as told by your health care provider. Write down the times that you check your glucose levels here: Time: _______________ Notes: ___________________________________ Time: _______________ Notes: ___________________________________ Time: _______________ Notes: ___________________________________ Time: _______________ Notes: ___________________________________ Time: _______________ Notes: ___________________________________ Time: _______________ Notes: ___________________________________  Low blood glucose Low blood glucose (hypoglycemia) is when glucose is at or below 70 mg/dL (3.9 mmol/L). Symptoms may include: Feeling: Hungry. Sweaty and clammy. Irritable or easily upset. Dizzy. Sleepy. Having: A fast heartbeat. A headache. A change in your vision. Numbness around the mouth, lips, or tongue. Having trouble with: Moving (coordination). Sleeping. Treating low blood glucose To treat low blood  glucose, eat or drink something containing sugar right away. If you can think clearly and swallow safely, follow the 15:15 rule: Take 15 grams of a fast-acting carb (carbohydrate), as told by your health care provider. Some fast-acting carbs are: Glucose tablets: take 3-4 tablets. Hard candy: eat 3-5 pieces. Fruit juice: drink 4 oz (120 mL). Regular (not diet) soda: drink 4-6 oz (120-180 mL). Honey or sugar: eat 1 Tbsp (15 mL). Check your blood glucose levels 15 minutes after you take the carb. If your glucose is still at or below 70 mg/dL (3.9 mmol/L), take 15 grams of a carb again. If your glucose does not go above 70 mg/dL (3.9 mmol/L) after 3 tries,  get help right away. After your glucose goes back to normal, eat a meal or a snack within 1 hour. Treating very low blood glucose If your glucose is at or below 54 mg/dL (3 mmol/L), you have very low blood glucose (severe hypoglycemia). This is an emergency. Do not wait to see if the symptoms will go away. Get medical help right away. Call your local emergency services (911 in the [place] Do not drive yourself to the hospital. Questions to ask your health care provider Should I talk with a diabetes educator? What equipment will I need to care for myself at home? What diabetes medicines do I need? When should I take them? How often do I need to check my blood glucose levels? What number can I call if I have questions? When is my follow-up visit? Where can I find a support group for people with diabetes? Where to find more information American Diabetes Association: www.diabetes.org Association of Diabetes Care and Education Specialists: www.diabeteseducator.org Contact a health care provider if: Your blood glucose is at or above 240 mg/dL (98.3 mmol/L) for 2 days in a row. You have been sick or have had a fever for 2 days or more, and you are not getting better. You have any of these problems for more than 6 hours: You cannot eat or  drink. You feel nauseous. You vomit. You have diarrhea. Get help right away if: Your blood glucose is lower than 54 mg/dL (3 mmol/L). You get confused. You have trouble thinking clearly. You have trouble breathing. These symptoms may represent a serious problem that is an emergency. Do not wait to see if the symptoms will go away. Get medical help right away. Call your local emergency services (911 in the [place] Do not drive yourself to the hospital. Summary Diabetes mellitus is a chronic disease that occurs when the body does not properly use sugar (glucose) that is released from food after you eat. Take insulin and diabetes medicines as told. Check your blood glucose every day, as often as told. Keep all follow-up visits. This is important. This information is not intended to replace advice given to you by your health care provider. Make sure you discuss any questions you have with your health care provider. Document Revised: 11/01/2019 Document Reviewed: 11/01/2019 Elsevier Patient Education  2022 Elsevier Inc. Diabetes Mellitus and Standards of Medical Care Living with and managing diabetes (diabetes mellitus) can be complicated. Your diabetes treatment may be managed by a team of health care providers, including: A physician who specializes in diabetes (endocrinologist). You might also have visits with a nurse practitioner or physician assistant. Nurses. A registered dietitian. A certified diabetes care and education specialist. An exercise specialist. A pharmacist. An eye doctor. A foot specialist (podiatrist). A dental care provider. A primary care provider. A mental health care provider. How to manage your diabetes You can do many things to successfully manage your diabetes. Your health care providers will follow guidelines to help you get the best quality of care. Here are general guidelines for your diabetes management plan. Your health care providers may give you more  specific instructions. Physical exams When you are diagnosed with diabetes, and each year after that, your health care provider will ask about your medical and family history. You will have a physical exam, which may include: Measuring your height, weight, and body mass index (BMI). Checking your blood pressure. This will be done at every routine medical visit. Your target blood pressure may vary depending on  your medical conditions, your age, and other factors. A thyroid exam. A skin exam. Screening for nerve damage (peripheral neuropathy). This may include checking the pulse in your legs and feet and the level of sensation in your hands and feet. A foot exam to inspect the structure and skin of your feet, including checking for cuts, bruises, redness, blisters, sores, or other problems. Screening for blood vessel (vascular) problems. This may include checking the pulse in your legs and feet and checking your temperature. Blood tests Depending on your treatment plan and your personal needs, you may have the following tests: Hemoglobin A1C (HbA1C). This test provides information about blood sugar (glucose) control over the previous 2-3 months. It is used to adjust your treatment plan, if needed. This test will be done: At least 2 times a year, if you are meeting your treatment goals. 4 times a year, if you are not meeting your treatment goals or if your goals have changed. Lipid testing, including total cholesterol, LDL and HDL cholesterol, and triglyceride levels. The goal for LDL is less than 100 mg/dL (5.5 mmol/L). If you are at high risk for complications, the goal is less than 70 mg/dL (3.9 mmol/L). The goal for HDL is 40 mg/dL (2.2 mmol/L) or higher for men, and 50 mg/dL (2.8 mmol/L) or higher for women. An HDL cholesterol of 60 mg/dL (3.3 mmol/L) or higher gives some protection against heart disease. The goal for triglycerides is less than 150 mg/dL (8.3 mmol/L). Liver function  tests. Kidney function tests. Thyroid function tests.  Dental and eye exams  Visit your dentist two times a year. If you have type 1 diabetes, your health care provider may recommend an eye exam within 5 years after you are diagnosed, and then once a year after your first exam. For children with type 1 diabetes, the health care provider may recommend an eye exam when your child is age 64 or older and has had diabetes for 3-5 years. After the first exam, your child should get an eye exam once a year. If you have type 2 diabetes, your health care provider may recommend an eye exam as soon as you are diagnosed, and then every 1-2 years after your first exam. Immunizations A yearly flu (influenza) vaccine is recommended annually for everyone 6 months or older. This is especially important if you have diabetes. The pneumonia (pneumococcal) vaccine is recommended for everyone 2 years or older who has diabetes. If you are age 64 or older, you may get the pneumonia vaccine as a series of two separate shots. The hepatitis B vaccine is recommended for adults shortly after being diagnosed with diabetes. Adults and children with diabetes should receive all other vaccines according to age-specific recommendations from the Centers for Disease Control and Prevention (CDC). Mental and emotional health Screening for symptoms of eating disorders, anxiety, and depression is recommended at the time of diagnosis and after as needed. If your screening shows that you have symptoms, you may need more evaluation. You may work with a mental health care provider. Follow these instructions at home: Treatment plan You will monitor your blood glucose levels and may give yourself insulin. Your treatment plan will be reviewed at every medical visit. You and your health care provider will discuss: How you are taking your medicines, including insulin. Any side effects you have. Your blood glucose level target goals. How often  you monitor your blood glucose level. Lifestyle habits, such as activity level and tobacco, alcohol, and substance use.  Education Your health care provider will assess how well you are monitoring your blood glucose levels and whether you are taking your insulin and medicines correctly. He or she may refer you to: A certified diabetes care and education specialist to manage your diabetes throughout your life, starting at diagnosis. A registered dietitian who can create and review your personal nutrition plan. An exercise specialist who can discuss your activity level and exercise plan. General instructions Take over-the-counter and prescription medicines only as told by your health care provider. Keep all follow-up visits. This is important. Where to find support There are many diabetes support networks, including: American Diabetes Association (ADA): diabetes.org Defeat Diabetes Foundation: defeatdiabetes.org Where to find more information American Diabetes Association (ADA): www.diabetes.org Association of Diabetes Care & Education Specialists (ADCES): diabeteseducator.org International Diabetes Federation (IDF): http://hill.biz/ Summary Managing diabetes (diabetes mellitus) can be complicated. Your diabetes treatment may be managed by a team of health care providers. Your health care providers follow guidelines to help you get the best quality care. You should have physical exams, blood tests, blood pressure monitoring, immunizations, and screening tests regularly. Stay updated on how to manage your diabetes. Your health care providers may also give you more specific instructions based on your individual health. This information is not intended to replace advice given to you by your health care provider. Make sure you discuss any questions you have with your health care provider. Document Revised: 01/05/2020 Document Reviewed: 01/05/2020 Elsevier Patient Education  2022 [employer].

## 2021-08-06 NOTE — Progress Notes (Signed)
Subjective:    Patient ID: Shelley Wood, female    DOB: 1955-09-23, 66 y.o.   MRN: [id number]  Chief Complaint  Patient presents with   Medical Management of Chronic Issues   Pt presents to the office today for chronic follow up. She has autism and development delay and  lives with her niece. She is doing a day program 5 days a day.  Hypertension This is a chronic problem. The current episode started more than 1 year ago. The problem has been resolved since onset. The problem is controlled. Pertinent negatives include no blurred vision, malaise/fatigue, peripheral edema or shortness of breath. Risk factors for coronary artery disease include dyslipidemia. The current treatment provides moderate improvement.  Diabetes She presents for her follow-up diabetic visit. She has type 2 diabetes mellitus. Pertinent negatives for diabetes include no blurred vision and no foot paresthesias. Pertinent negatives for diabetic complications include no heart disease. Risk factors for coronary artery disease include dyslipidemia, diabetes mellitus, hypertension, sedentary lifestyle and post-menopausal. She is following a generally healthy diet. Her overall blood glucose range is 140-180 mg/dl. Eye exam is current.  Hyperlipidemia This is a chronic problem. The current episode started more than 1 year ago. The problem is controlled. Recent lipid tests were reviewed and are normal. Pertinent negatives include no shortness of breath. Current antihyperlipidemic treatment includes statins. The current treatment provides moderate improvement of lipids. Risk factors for coronary artery disease include dyslipidemia, diabetes mellitus, hypertension, a sedentary lifestyle and post-menopausal.     Review of Systems  Constitutional:  Negative for malaise/fatigue.  Eyes:  Negative for blurred vision.  Respiratory:  Negative for shortness of breath.   All other systems reviewed and are negative.     Objective:    Physical Exam Vitals reviewed.  Constitutional:      General: She is not in acute distress.    Appearance: She is well-developed.  HENT:     Head: Normocephalic and atraumatic.     Right Ear: Tympanic membrane normal.     Left Ear: Tympanic membrane normal.  Eyes:     Pupils: Pupils are equal, round, and reactive to light.  Neck:     Thyroid: No thyromegaly.  Cardiovascular:     Rate and Rhythm: Normal rate and regular rhythm.     Heart sounds: Normal heart sounds. No murmur heard. Pulmonary:     Effort: Pulmonary effort is normal. No respiratory distress.     Breath sounds: Normal breath sounds. No wheezing.  Abdominal:     General: Bowel sounds are normal. There is no distension.     Palpations: Abdomen is soft.     Tenderness: There is no abdominal tenderness.  Musculoskeletal:        General: No tenderness. Normal range of motion.     Cervical back: Normal range of motion and neck supple.  Skin:    General: Skin is warm and dry.  Neurological:     Mental Status: She is alert and oriented to person, place, and time.     Cranial Nerves: No cranial nerve deficit.     Deep Tendon Reflexes: Reflexes are normal and symmetric.  Psychiatric:        Behavior: Behavior normal.        Thought Content: Thought content normal.        Judgment: Judgment normal.      BP (!) 103/55    Pulse 80    Temp (!) 97.3 F (36.3 C) (Temporal)  Ht '4\' 7"'$  (1.397 m)    Wt 118 lb (53.5 kg)    BMI 27.43 kg/m      Assessment & Plan:  Shelley Wood comes in today with chief complaint of Medical Management of Chronic Issues   Diagnosis and orders addressed:  1. Hypertension associated with diabetes ([place]) - amLODipine (NORVASC) 10 MG tablet; Take 1 tablet (10 mg total) by mouth daily.  Dispense: 90 tablet; Refill: 3 - olmesartan (BENICAR) 40 MG tablet; Take 1 tablet (40 mg total) by mouth daily.  Dispense: 30 tablet; Refill: 0 - CMP14+EGFR - CBC with Differential/Platelet  2. Type 2  diabetes mellitus with other specified complication, without long-term current use of insulin (HCC) - empagliflozin (JARDIANCE) 10 MG TABS tablet; Take 1 tablet (10 mg total) by mouth daily.  Dispense: 90 tablet; Refill: 0 - sitaGLIPtin (JANUVIA) 100 MG tablet; Take 1 tablet (100 mg total) by mouth daily. (Needs to be seen before next refill)  Dispense: 90 tablet; Refill: 2 - metFORMIN (GLUCOPHAGE) 500 MG tablet; Take 1 tablet (500 mg total) by mouth daily with breakfast. (Needs to be seen before next refill)  Dispense: 180 tablet; Refill: 2 - Bayer DCA Hb A1c Waived - CMP14+EGFR - CBC with Differential/Platelet  3. Hyperlipidemia, unspecified hyperlipidemia type - pravastatin (PRAVACHOL) 40 MG tablet; Take 1 tablet (40 mg total) by mouth daily.  Dispense: 30 tablet; Refill: 0 - CMP14+EGFR - CBC with Differential/Platelet - Lipid panel  4. Autism - CMP14+EGFR - CBC with Differential/Platelet  5. Development delay - CMP14+EGFR - CBC with Differential/Platelet  6. Colon cancer screening - Ambulatory referral to Gastroenterology - CMP14+EGFR - CBC with Differential/Platelet  7. Encounter for screening for HIV - CMP14+EGFR - CBC with Differential/Platelet - HIV Antibody (routine testing w rflx)  8. Screening-pulmonary TB - CMP14+EGFR - CBC with Differential/Platelet - QuantiFERON-TB Gold Plus  9. Need for hepatitis B screening test - CMP14+EGFR - CBC with Differential/Platelet - Hepatitis B surface antibody,qualitative   Labs pending Health Maintenance reviewed Diet and exercise encouraged  Follow up plan: 6 months    Evelina Dun, FNP

## 2021-08-07 LAB — CBC WITH DIFFERENTIAL/PLATELET
Basophils Absolute: 0 10*3/uL (ref 0.0–0.2)
Basos: 0 %
EOS (ABSOLUTE): 0.1 10*3/uL (ref 0.0–0.4)
Eos: 1 %
Hematocrit: 43.6 % (ref 34.0–46.6)
Hemoglobin: 14 g/dL (ref 11.1–15.9)
Immature Grans (Abs): 0 10*3/uL (ref 0.0–0.1)
Immature Granulocytes: 0 %
Lymphocytes Absolute: 1.7 10*3/uL (ref 0.7–3.1)
Lymphs: 35 %
MCH: 28.2 pg (ref 26.6–33.0)
MCHC: 32.1 g/dL (ref 31.5–35.7)
MCV: 88 fL (ref 79–97)
Monocytes Absolute: 0.3 10*3/uL (ref 0.1–0.9)
Monocytes: 7 %
Neutrophils Absolute: 2.8 10*3/uL (ref 1.4–7.0)
Neutrophils: 57 %
Platelets: 395 10*3/uL (ref 150–450)
RBC: 4.96 x10E6/uL (ref 3.77–5.28)
RDW: 14.5 % (ref 11.7–15.4)
WBC: 4.9 10*3/uL (ref 3.4–10.8)

## 2021-08-07 LAB — LIPID PANEL
Chol/HDL Ratio: 3.7 ratio (ref 0.0–4.4)
Cholesterol, Total: 148 mg/dL (ref 100–199)
HDL: 40 mg/dL (ref >39)
LDL Chol Calc (NIH): 96 mg/dL (ref 0–99)
Triglycerides: 59 mg/dL (ref 0–149)
VLDL Cholesterol Cal: 12 mg/dL (ref 5–40)

## 2021-08-07 LAB — CMP14+EGFR
ALT: 17 IU/L (ref 0–32)
AST: 15 IU/L (ref 0–40)
Albumin/Globulin Ratio: 1.7 (ref 1.2–2.2)
Albumin: 4.9 g/dL — ABNORMAL HIGH (ref 3.8–4.8)
Alkaline Phosphatase: 66 IU/L (ref 44–121)
BUN/Creatinine Ratio: 16 (ref 12–28)
BUN: 14 mg/dL (ref 8–27)
Bilirubin Total: 0.3 mg/dL (ref 0.0–1.2)
CO2: 24 mmol/L (ref 20–29)
Calcium: 10.5 mg/dL — ABNORMAL HIGH (ref 8.7–10.3)
Chloride: 102 mmol/L (ref 96–106)
Creatinine, Ser: 0.88 mg/dL (ref 0.57–1.00)
Globulin, Total: 2.9 g/dL (ref 1.5–4.5)
Glucose: 145 mg/dL — ABNORMAL HIGH (ref 70–99)
Potassium: 5.4 mmol/L — ABNORMAL HIGH (ref 3.5–5.2)
Sodium: 140 mmol/L (ref 134–144)
Total Protein: 7.8 g/dL (ref 6.0–8.5)
eGFR: 74 mL/min/{1.73_m2} (ref >59)

## 2021-08-07 LAB — HEPATITIS B SURFACE ANTIBODY,QUALITATIVE: Hep B Surface Ab, Qual: NONREACTIVE

## 2021-08-07 LAB — HIV ANTIBODY (ROUTINE TESTING W REFLEX): HIV Screen 4th Generation wRfx: NONREACTIVE

## 2021-08-08 ENCOUNTER — Other Ambulatory Visit: Payer: Self-pay | Admitting: Family

## 2021-08-08 DIAGNOSIS — E875 Hyperkalemia: Secondary | ICD-10-CM

## 2021-08-13 ENCOUNTER — Other Ambulatory Visit: Payer: Self-pay | Admitting: Family

## 2021-08-15 ENCOUNTER — Encounter: Payer: Self-pay | Admitting: Family Medicine

## 2021-08-16 ENCOUNTER — Other Ambulatory Visit: Payer: Self-pay | Admitting: Family

## 2021-09-02 ENCOUNTER — Other Ambulatory Visit: Payer: Self-pay | Admitting: Family

## 2021-09-02 DIAGNOSIS — I152 Hypertension secondary to endocrine disorders: Secondary | ICD-10-CM

## 2021-09-02 DIAGNOSIS — E1159 Type 2 diabetes mellitus with other circulatory complications: Secondary | ICD-10-CM

## 2021-09-02 DIAGNOSIS — E785 Hyperlipidemia, unspecified: Secondary | ICD-10-CM

## 2021-09-13 ENCOUNTER — Ambulatory Visit: Payer: Medicare Other | Admitting: Family

## 2021-10-05 ENCOUNTER — Other Ambulatory Visit: Payer: Self-pay | Admitting: Family

## 2021-10-05 DIAGNOSIS — I152 Hypertension secondary to endocrine disorders: Secondary | ICD-10-CM

## 2021-10-07 ENCOUNTER — Other Ambulatory Visit: Payer: Self-pay | Admitting: Family

## 2021-10-07 NOTE — Telephone Encounter (Signed)
?  Prescription Request ? ?10/07/2021 ? ?Is this a "Controlled Substance" medicine? omeprazole (PRILOSEC) 20 MG capsule ? ?Have you seen your PCP in the last 2 weeks? no ? ?If YES, route message to pool  -  If NO, patient needs to be scheduled for appointment. ? ?What is the name of the medication or equipment? omeprazole (PRILOSEC) 20 MG capsule ? ?Have you contacted your pharmacy to request a refill? yes  ? ?Which pharmacy would you like this sent to? [facility] on [address]  ? ? ?Patient notified that their request is being sent to the clinical staff for review and that they should receive a response within 2 business days.  ? ? ?

## 2021-10-08 MED ORDER — OMEPRAZOLE 20 MG PO CPDR: 20.0000 mg | DELAYED_RELEASE_CAPSULE | Freq: Every day | ORAL | 1 refills | Status: DC

## 2021-10-08 NOTE — Telephone Encounter (Signed)
LMOVM refill sent to pharmacy 

## 2021-10-22 ENCOUNTER — Other Ambulatory Visit: Payer: Self-pay | Admitting: Family

## 2021-10-22 DIAGNOSIS — E1159 Type 2 diabetes mellitus with other circulatory complications: Secondary | ICD-10-CM

## 2021-11-01 ENCOUNTER — Other Ambulatory Visit: Payer: Self-pay | Admitting: Family

## 2021-11-01 DIAGNOSIS — E1169 Type 2 diabetes mellitus with other specified complication: Secondary | ICD-10-CM

## 2021-11-04 ENCOUNTER — Telehealth: Payer: Self-pay | Admitting: Family

## 2021-11-04 NOTE — Telephone Encounter (Signed)
Pharmacy aware

## 2021-11-04 NOTE — Telephone Encounter (Signed)
Pharmacy calling to verify medication combination that patient is taking at this time.  ?

## 2021-12-11 ENCOUNTER — Ambulatory Visit: Payer: Medicare Other

## 2021-12-30 ENCOUNTER — Ambulatory Visit (INDEPENDENT_AMBULATORY_CARE_PROVIDER_SITE_OTHER): Payer: Medicare Other

## 2021-12-30 VITALS — Wt 109.0 lb

## 2021-12-30 DIAGNOSIS — Z0000 Encounter for general adult medical examination without abnormal findings: Secondary | ICD-10-CM | POA: Diagnosis not present

## 2021-12-30 NOTE — Progress Notes (Signed)
Subjective:   Shelley Wood is a 66 y.o. female who presents for Medicare Annual (Subsequent) preventive examination.  Virtual Visit via Telephone Note  I connected with  Shelley Wood on 12/30/21 at  3:30 PM EDT by telephone and verified that I am speaking with the correct person using two identifiers.  Location: Patient: Home Provider: [facility] Persons participating in the virtual visit: patient, her niece and caregiver, Shelley Wood, and Nurse Health Advisor   I discussed the limitations, risks, security and privacy concerns of performing an evaluation and management service by telephone and the availability of in person appointments. The patient expressed understanding and agreed to proceed.  Interactive audio and video telecommunications were attempted between this nurse and patient, however failed, due to patient having technical difficulties OR patient did not have access to video capability.  We continued and completed visit with audio only.  Some vital signs may be absent or patient reported.   Taisley Mordan E Nimesh Riolo, LPN   Review of Systems     Cardiac Risk Factors include: diabetes mellitus;dyslipidemia;hypertension     Objective:    Today's Vitals   12/30/21 1532  Weight: 109 lb (49.4 kg)   Body mass index is 25.33 kg/m.     12/30/2021    3:42 PM 12/07/2020    1:34 PM 12/31/2018    3:50 PM  Advanced Directives  Does Patient Have a Medical Advance Directive? No Yes No  Type of Advance Directive  [place] in Chart?  No - copy requested   Would patient like information on creating a medical advance directive? No - Patient declined  Yes (MAU/Ambulatory/Procedural Areas - Information given)    Current Medications (verified) Outpatient Encounter Medications as of 12/30/2021  Medication Sig   amLODipine (NORVASC) 10 MG tablet TAKE ONE TABLET ONCE DAILY   blood glucose meter kit and supplies Dispense based on patient and  insurance preference. Use up to four times daily as directed. (FOR ICD-10 E10.9, E11.9).   Blood Glucose Monitoring Suppl (ONE TOUCH ULTRA 2) w/Device KIT Test BS QID Dx E11.9   glucose blood (ACCU-CHEK GUIDE) test strip Check BS up to 4 times daily Dx E11.9   JARDIANCE 10 MG TABS tablet TAKE 1 TABLET(10 MG) BY MOUTH DAILY   metFORMIN (GLUCOPHAGE) 500 MG tablet Take 1 tablet (500 mg total) by mouth daily with breakfast. (Needs to be seen before next refill)   Multiple Vitamin (MULTI-VITAMIN DAILY PO) Take by mouth.   olmesartan (BENICAR) 40 MG tablet TAKE 1 TABLET(40 MG) BY MOUTH DAILY   omeprazole (PRILOSEC) 20 MG capsule Take 1 capsule (20 mg total) by mouth daily.   ONETOUCH DELICA LANCETS 16S MISC    pravastatin (PRAVACHOL) 40 MG tablet TAKE 1 TABLET(40 MG) BY MOUTH DAILY   sitaGLIPtin (JANUVIA) 100 MG tablet Take 1 tablet (100 mg total) by mouth daily. (Needs to be seen before next refill)   No facility-administered encounter medications on file as of 12/30/2021.    Allergies (verified) Patient has no known allergies.   History: Past Medical History:  Diagnosis Date   Allergy    Cataract    Depression    Diabetes mellitus without complication (HCC)    Emphysema of lung (HCC)    GERD (gastroesophageal reflux disease)    Heart murmur    Hyperlipidemia    Hypertension    History reviewed. No pertinent surgical history. Family History  Problem Relation Age of Onset  Arthritis Mother    Heart disease Mother    Hypertension Mother    Stroke Mother    Alcohol abuse Father    Arthritis Father    Early death Father    Heart disease Father    Arthritis Sister    Birth defects Sister    Cancer Sister    COPD Sister    Hyperlipidemia Sister    Hypertension Sister    Arthritis Brother    Hyperlipidemia Brother    Hypertension Brother    Arthritis Maternal Grandmother    Arthritis Maternal Grandfather    Arthritis Brother    Arthritis Sister    Cancer Sister     Arthritis Sister    Learning disabilities Sister    Mental retardation Sister    Arthritis Sister    Arthritis Sister    Social History   Socioeconomic History   Marital status: Single    Spouse name: Not on file   Number of children: Not on file   Years of education: Not on file   Highest education level: Not on file  Occupational History   Occupation: Disabled  Tobacco Use   Smoking status: Never   Smokeless tobacco: Never  Vaping Use   Vaping Use: Never used  Substance and Sexual Activity   Alcohol use: No   Drug use: No   Sexual activity: Never  Other Topics Concern   Not on file  Social History Narrative   She is mentally challenged; Lives with her sister, has a caretaker weekdays 8-5   Social Determinants of Health   Financial Resource Strain: Low Risk  (12/30/2021)   Overall Financial Resource Strain (CARDIA)    Difficulty of Paying Living Expenses: Not hard at all  Food Insecurity: No Food Insecurity (12/30/2021)   Hunger Vital Sign    Worried About Running Out of Food in the Last Year: Never true    [place] in the Last Year: Never true  Transportation Needs: No Transportation Needs (12/30/2021)   PRAPARE - Hydrologist (Medical): No    Lack of Transportation (Non-Medical): No  Physical Activity: Sufficiently Active (12/30/2021)   Exercise Vital Sign    Days of Exercise per Week: 3 days    Minutes of Exercise per Session: 60 min  Stress: No Stress Concern Present (12/30/2021)   [place]    Feeling of Stress : Not at all  Social Connections: Moderately Integrated (12/30/2021)   Social Connection and Isolation Panel [NHANES]    Frequency of Communication with Friends and Family: More than three times a week    Frequency of Social Gatherings with Friends and Family: More than three times a week    Attends Religious Services: More than 4 times per year    Active  Member of [employer] or Organizations: Yes    Attends Music therapist: More than 4 times per year    Marital Status: Never married    Tobacco Counseling Counseling given: Not Answered   Clinical Intake:  Pre-visit preparation completed: Yes  Pain : No/denies pain     BMI - recorded: 25.33 Nutritional Status: BMI 25 -29 Overweight Nutritional Risks: None Diabetes: Yes CBG done?: No Did pt. bring in CBG monitor from home?: No  How often do you need to have someone help you when you read instructions, pamphlets, or other written materials from your doctor or pharmacy?: 3 - Sometimes  Diabetic? Nutrition Risk Assessment:  Has the patient had any N/V/D within the last 2 months?  No  Does the patient have any non-healing wounds?  No  Has the patient had any unintentional weight loss or weight gain?  No   Diabetes:  Is the patient diabetic?  Yes  If diabetic, was a CBG obtained today?  No  Did the patient bring in their glucometer from home?  No  How often do you monitor your CBG's? Once or twice per day - 106 this am.   Financial Strains and Diabetes Management:  Are you having any financial strains with the device, your supplies or your medication? No .  Does the patient want to be seen by Chronic Care Management for management of their diabetes?  No  Would the patient like to be referred to a Nutritionist or for Diabetic Management?  No   Diabetic Exams:  Diabetic Eye Exam: Completed 2022. Overdue for diabetic eye exam. Pt has been advised about the importance in completing this exam. Shelley Wood says she will call and make an appt.  Diabetic Foot Exam: Completed 09/28/2020. Pt has been advised about the importance in completing this exam. Pt is scheduled for diabetic foot exam on 01/31/2022.    Interpreter Needed?: No  Information entered by :: Shelley Boulais, LPN   Activities of Daily Living    12/30/2021    3:43 PM  In your present state of health, do you have  any difficulty performing the following activities:  Hearing? 0  Vision? 0  Difficulty concentrating or making decisions? 1  Walking or climbing stairs? 0  Dressing or bathing? 1  Comment does this independently, but has assistance with temperature and choosing clothes at times  Doing errands, shopping? 1  Preparing Food and eating ? N  Using the Toilet? N  In the past six months, have you accidently leaked urine? N  Do you have problems with loss of bowel control? N  Managing your Medications? Y  Managing your Finances? Y  Housekeeping or managing your Housekeeping? N    Patient Care Team: Sharion Balloon, FNP as PCP - General (Family Medicine) Harlen Labs, MD as Referring Physician (Optometry) Steffanie Rainwater, DPM as Consulting Physician (Podiatry)  Indicate any recent Medical Services you may have received from other than [facility] providers in the past year (date may be approximate).     Assessment:   This is a routine wellness examination for Shelley Wood.  Hearing/Vision screen Hearing Screening - Comments:: Denies hearing difficulties   Vision Screening - Comments:: Wears rx glasses - up to date with routine eye exams with [facility]  Dietary issues and exercise activities discussed: Current Exercise Habits: Home exercise routine;Structured exercise class, Type of exercise: walking;calisthenics;stretching;yoga, Time (Minutes): 60, Frequency (Times/Week): 3, Weekly Exercise (Minutes/Week): 180, Intensity: Mild, Exercise limited by: psychological condition(s)   Goals Addressed             This Visit's Progress    Exercise 3x per week (30 min per time)   On track    Continue weight watchers and exercise program       Depression Screen    12/30/2021    3:53 PM 08/06/2021    9:21 AM 12/07/2020    1:24 PM 11/14/2020   10:49 AM 09/28/2020   12:28 PM 12/31/2018    3:51 PM 12/15/2018   11:11 AM  PHQ 2/9 Scores  PHQ - 2 Score 0 0 0 0 0 0  0  PHQ- 9 Score     0       Fall Risk    12/30/2021    3:37 PM 08/06/2021    9:21 AM 12/07/2020    1:34 PM 11/14/2020   10:49 AM 12/31/2018    3:51 PM  [place] in the past year? 0 0 0 0 0  Number falls in past yr: 0  0  0  Injury with Fall? 0  0  0  Risk for fall due to : No Fall Risks  No Fall Risks    Follow up Falls prevention discussed  Falls prevention discussed      FALL RISK PREVENTION PERTAINING TO THE HOME:  Any stairs in or around the home? Yes  If so, are there any without handrails? No  Home free of loose throw rugs in walkways, pet beds, electrical cords, etc? Yes  Adequate lighting in your home to reduce risk of falls? Yes   ASSISTIVE DEVICES UTILIZED TO PREVENT FALLS:  Life alert? No  Use of a cane, walker or w/c? No  Grab bars in the bathroom? No  Shower chair or bench in shower? No  Elevated toilet seat or a handicapped toilet? No   TIMED UP AND GO:  Was the test performed? No . Telephonic visit  Cognitive Function:        12/30/2021    3:46 PM 12/31/2018    3:52 PM  6CIT Screen  What Year? 4 points 0 points  What month? 0 points 0 points  What time? 0 points 0 points  Count back from 20 4 points 2 points  Months in reverse 4 points 4 points  Repeat phrase 2 points 4 points  Total Score 14 points 10 points    Immunizations Immunization History  Administered Date(s) Administered   Influenza,inj,Quad PF,6+ Mos 06/09/2017, 06/15/2018, 09/15/2019, 08/06/2021   Moderna SARS-COV2 Booster Vaccination 03/12/2021   Moderna Sars-Covid-2 Vaccination 09/28/2019, 10/26/2019, 06/19/2020   Pneumococcal Polysaccharide-23 09/28/2020   Tdap 09/28/2020    TDAP status: Up to date  Flu Vaccine status: Up to date  Pneumococcal vaccine status: Up to date  Covid-19 vaccine status: Completed vaccines  Qualifies for Shingles Vaccine? Yes   Zostavax completed No   Shingrix Completed?: No.    Education has been provided regarding the importance of this vaccine. Patient has  been advised to call insurance company to determine out of pocket expense if they have not yet received this vaccine. Advised may also receive vaccine at local pharmacy or Health Dept. Verbalized acceptance and understanding.  Screening Tests Health Maintenance  Topic Date Due   OPHTHALMOLOGY EXAM  Never done   Fecal DNA (Cologuard)  Never done   Zoster Vaccines- Shingrix (1 of 2) Never done   COVID-19 Vaccine (4 - Moderna series) 05/07/2021   FOOT EXAM  09/28/2021   HEMOGLOBIN A1C  02/03/2022   INFLUENZA VACCINE  02/11/2022   MAMMOGRAM  01/22/2023   TETANUS/TDAP  09/29/2030   Hepatitis C Screening  Completed   HIV Screening  Completed   HPV VACCINES  Aged Out    Health Maintenance  Health Maintenance Due  Topic Date Due   OPHTHALMOLOGY EXAM  Never done   Fecal DNA (Cologuard)  Never done   Zoster Vaccines- Shingrix (1 of 2) Never done   COVID-19 Vaccine (4 - Moderna series) 05/07/2021   FOOT EXAM  09/28/2021    Colorectal cancer screening: No longer required.  declined  Mammogram  status: Completed 01/21/2021. Repeat every year  DEXA due at age 89  Lung Cancer Screening: (Low Dose CT Chest recommended if Age 87-80 years, 30 pack-year currently smoking OR have quit w/in 15years.) does not qualify.  Additional Screening:  Hepatitis C Screening: does qualify; Completed 01/10/2020  Vision Screening: Recommended annual ophthalmology exams for early detection of glaucoma and other disorders of the eye. Is the patient up to date with their annual eye exam?  No  Who is the provider or what is the name of the office in which the patient attends annual eye exams? [facility] If pt is not established with a provider, would they like to be referred to a provider to establish care? No .   Dental Screening: Recommended annual dental exams for proper oral hygiene  Community Resource Referral / Chronic Care Management: CRR required this visit?  No   CCM required this  visit?  No      Plan:     I have personally reviewed and noted the following in the patient's chart:   Medical and social history Use of alcohol, tobacco or illicit drugs  Current medications and supplements including opioid prescriptions.  Functional ability and status Nutritional status Physical activity Advanced directives List of other physicians Hospitalizations, surgeries, and ER visits in previous 12 months Vitals Screenings to include cognitive, depression, and falls Referrals and appointments  In addition, I have reviewed and discussed with patient certain preventive protocols, quality metrics, and best practice recommendations. A written personalized care plan for preventive services as well as general preventive health recommendations were provided to patient.     Shelley Hammond, LPN   9/38/1829   Nurse Notes: None

## 2021-12-30 NOTE — Patient Instructions (Signed)
Shelley Wood , Thank you for taking time to come for your Medicare Wellness Visit. I appreciate your ongoing commitment to your health goals. Please review the following plan we discussed and let me know if I can assist you in the future.   Screening recommendations/referrals: Colonoscopy: Due - consider Colguard - once every 3 years - at home - non-invasive Mammogram: Done 01/21/2021 - Repeat annually  Bone Density: Due at age 64 Recommended yearly ophthalmology/optometry visit for glaucoma screening and checkup Recommended yearly dental visit for hygiene and checkup  Vaccinations: Influenza vaccine: Done 08/06/2021 - repeat in fall Pneumococcal vaccine: Done 09/28/2020 Tdap vaccine: Done 09/28/2020 - Repeat in 10 years Shingles vaccine: Due - Shingrix is 2 doses 2-6 months apart and over 90% effective    Covid-19: Done 09/28/2019, 10/26/2019, 06/19/2020, & 03/12/2021  Advanced directives: Advance directive discussed with you today. Even though you declined this today, please call our office should you change your mind, and we can give you the proper paperwork for you to fill out.   Conditions/risks identified: Keep up the great work!   Next appointment: Follow up in one year for your annual wellness visit.   Preventive Care 40-64 Years, Female Preventive care refers to lifestyle choices and visits with your health care provider that can promote health and wellness. What does preventive care include? A yearly physical exam. This is also called an annual well check. Dental exams once or twice a year. Routine eye exams. Ask your health care provider how often you should have your eyes checked. Personal lifestyle choices, including: Daily care of your teeth and gums. Regular physical activity. Eating a healthy diet. Avoiding tobacco and drug use. Limiting alcohol use. Practicing safe sex. Taking low-dose aspirin daily starting at age 61. Taking vitamin and mineral supplements as recommended  by your health care provider. What happens during an annual well check? The services and screenings done by your health care provider during your annual well check will depend on your age, overall health, lifestyle risk factors, and family history of disease. Counseling  Your health care provider may ask you questions about your: Alcohol use. Tobacco use. Drug use. Emotional well-being. Home and relationship well-being. Sexual activity. Eating habits. Work and work Statistician. Method of birth control. Menstrual cycle. Pregnancy history. Screening  You may have the following tests or measurements: Height, weight, and BMI. Blood pressure. Lipid and cholesterol levels. These may be checked every 5 years, or more frequently if you are over 60 years old. Skin check. Lung cancer screening. You may have this screening every year starting at age 64 if you have a 30-pack-year history of smoking and currently smoke or have quit within the past 15 years. Fecal occult blood test (FOBT) of the stool. You may have this test every year starting at age 61. Flexible sigmoidoscopy or colonoscopy. You may have a sigmoidoscopy every 5 years or a colonoscopy every 10 years starting at age 24. Hepatitis C blood test. Hepatitis B blood test. Sexually transmitted disease (STD) testing. Diabetes screening. This is done by checking your blood sugar (glucose) after you have not eaten for a while (fasting). You may have this done every 1-3 years. Mammogram. This may be done every 1-2 years. Talk to your health care provider about when you should start having regular mammograms. This may depend on whether you have a family history of breast cancer. BRCA-related cancer screening. This may be done if you have a family history of breast, ovarian, tubal, or  peritoneal cancers. Pelvic exam and Pap test. This may be done every 3 years starting at age 63. Starting at age 31, this may be done every 5 years if you have a  Pap test in combination with an HPV test. Bone density scan. This is done to screen for osteoporosis. You may have this scan if you are at high risk for osteoporosis. Discuss your test results, treatment options, and if necessary, the need for more tests with your health care provider. Vaccines  Your health care provider may recommend certain vaccines, such as: Influenza vaccine. This is recommended every year. Tetanus, diphtheria, and acellular pertussis (Tdap, Td) vaccine. You may need a Td booster every 10 years. Zoster vaccine. You may need this after age 36. Pneumococcal 13-valent conjugate (PCV13) vaccine. You may need this if you have certain conditions and were not previously vaccinated. Pneumococcal polysaccharide (PPSV23) vaccine. You may need one or two doses if you smoke cigarettes or if you have certain conditions. Talk to your health care provider about which screenings and vaccines you need and how often you need them. This information is not intended to replace advice given to you by your health care provider. Make sure you discuss any questions you have with your health care provider. Document Released: 07/27/2015 Document Revised: 03/19/2016 Document Reviewed: 05/01/2015 Elsevier Interactive Patient Education  2017 [employer] Prevention in the Home Falls can cause injuries. They can happen to people of all ages. There are many things you can do to make your home safe and to help prevent falls. What can I do on the outside of my home? Regularly fix the edges of walkways and driveways and fix any cracks. Remove anything that might make you trip as you walk through a door, such as a raised step or threshold. Trim any bushes or trees on the path to your home. Use bright outdoor lighting. Clear any walking paths of anything that might make someone trip, such as rocks or tools. Regularly check to see if handrails are loose or broken. Make sure that both sides of any  steps have handrails. Any raised decks and porches should have guardrails on the edges. Have any leaves, snow, or ice cleared regularly. Use sand or salt on walking paths during winter. Clean up any spills in your garage right away. This includes oil or grease spills. What can I do in the bathroom? Use night lights. Install grab bars by the toilet and in the tub and shower. Do not use towel bars as grab bars. Use non-skid mats or decals in the tub or shower. If you need to sit down in the shower, use a plastic, non-slip stool. Keep the floor dry. Clean up any water that spills on the floor as soon as it happens. Remove soap buildup in the tub or shower regularly. Attach bath mats securely with double-sided non-slip rug tape. Do not have throw rugs and other things on the floor that can make you trip. What can I do in the bedroom? Use night lights. Make sure that you have a light by your bed that is easy to reach. Do not use any sheets or blankets that are too big for your bed. They should not hang down onto the floor. Have a firm chair that has side arms. You can use this for support while you get dressed. Do not have throw rugs and other things on the floor that can make you trip. What can I do in  the kitchen? Clean up any spills right away. Avoid walking on wet floors. Keep items that you use a lot in easy-to-reach places. If you need to reach something above you, use a strong step stool that has a grab bar. Keep electrical cords out of the way. Do not use floor polish or wax that makes floors slippery. If you must use wax, use non-skid floor wax. Do not have throw rugs and other things on the floor that can make you trip. What can I do with my stairs? Do not leave any items on the stairs. Make sure that there are handrails on both sides of the stairs and use them. Fix handrails that are broken or loose. Make sure that handrails are as long as the stairways. Check any carpeting to  make sure that it is firmly attached to the stairs. Fix any carpet that is loose or worn. Avoid having throw rugs at the top or bottom of the stairs. If you do have throw rugs, attach them to the floor with carpet tape. Make sure that you have a light switch at the top of the stairs and the bottom of the stairs. If you do not have them, ask someone to add them for you. What else can I do to help prevent falls? Wear shoes that: Do not have high heels. Have rubber bottoms. Are comfortable and fit you well. Are closed at the toe. Do not wear sandals. If you use a stepladder: Make sure that it is fully opened. Do not climb a closed stepladder. Make sure that both sides of the stepladder are locked into place. Ask someone to hold it for you, if possible. Clearly mark and make sure that you can see: Any grab bars or handrails. First and last steps. Where the edge of each step is. Use tools that help you move around (mobility aids) if they are needed. These include: Canes. Walkers. Scooters. Crutches. Turn on the lights when you go into a dark area. Replace any light bulbs as soon as they burn out. Set up your furniture so you have a clear path. Avoid moving your furniture around. If any of your floors are uneven, fix them. If there are any pets around you, be aware of where they are. Review your medicines with your doctor. Some medicines can make you feel dizzy. This can increase your chance of falling. Ask your doctor what other things that you can do to help prevent falls. This information is not intended to replace advice given to you by your health care provider. Make sure you discuss any questions you have with your health care provider. Document Released: 04/26/2009 Document Revised: 12/06/2015 Document Reviewed: 08/04/2014 Elsevier Interactive Patient Education  2017 [employer].

## 2022-01-04 ENCOUNTER — Other Ambulatory Visit: Payer: Self-pay | Admitting: Family

## 2022-01-17 ENCOUNTER — Other Ambulatory Visit: Payer: Self-pay | Admitting: Family

## 2022-01-17 DIAGNOSIS — E1159 Type 2 diabetes mellitus with other circulatory complications: Secondary | ICD-10-CM

## 2022-01-20 ENCOUNTER — Other Ambulatory Visit: Payer: Self-pay | Admitting: Family

## 2022-01-20 DIAGNOSIS — I152 Hypertension secondary to endocrine disorders: Secondary | ICD-10-CM

## 2022-01-31 ENCOUNTER — Ambulatory Visit: Payer: Medicare Other | Admitting: Family

## 2022-02-02 ENCOUNTER — Other Ambulatory Visit: Payer: Self-pay | Admitting: Family

## 2022-02-02 DIAGNOSIS — E1169 Type 2 diabetes mellitus with other specified complication: Secondary | ICD-10-CM

## 2022-02-04 ENCOUNTER — Other Ambulatory Visit: Payer: Self-pay | Admitting: Family

## 2022-02-04 DIAGNOSIS — E1169 Type 2 diabetes mellitus with other specified complication: Secondary | ICD-10-CM

## 2022-02-07 ENCOUNTER — Ambulatory Visit: Payer: Medicare Other | Admitting: Family

## 2022-02-18 ENCOUNTER — Ambulatory Visit (INDEPENDENT_AMBULATORY_CARE_PROVIDER_SITE_OTHER): Payer: Medicare Other | Admitting: Family

## 2022-02-18 ENCOUNTER — Encounter: Payer: Self-pay | Admitting: Family

## 2022-02-18 VITALS — BP 89/55 | HR 83 | Temp 97.6°F | Ht <= 58 in | Wt 106.8 lb

## 2022-02-18 DIAGNOSIS — E1169 Type 2 diabetes mellitus with other specified complication: Secondary | ICD-10-CM

## 2022-02-18 DIAGNOSIS — R6250 Unspecified lack of expected normal physiological development in childhood: Secondary | ICD-10-CM

## 2022-02-18 DIAGNOSIS — F840 Autistic disorder: Secondary | ICD-10-CM | POA: Diagnosis not present

## 2022-02-18 DIAGNOSIS — I152 Hypertension secondary to endocrine disorders: Secondary | ICD-10-CM | POA: Diagnosis not present

## 2022-02-18 DIAGNOSIS — E785 Hyperlipidemia, unspecified: Secondary | ICD-10-CM

## 2022-02-18 DIAGNOSIS — K219 Gastro-esophageal reflux disease without esophagitis: Secondary | ICD-10-CM | POA: Diagnosis not present

## 2022-02-18 DIAGNOSIS — Z1211 Encounter for screening for malignant neoplasm of colon: Secondary | ICD-10-CM

## 2022-02-18 DIAGNOSIS — E1159 Type 2 diabetes mellitus with other circulatory complications: Secondary | ICD-10-CM | POA: Diagnosis not present

## 2022-02-18 DIAGNOSIS — I959 Hypotension, unspecified: Secondary | ICD-10-CM

## 2022-02-18 LAB — CMP14+EGFR
ALT: 18 IU/L (ref 0–32)
AST: 10 IU/L (ref 0–40)
Albumin/Globulin Ratio: 1.8 (ref 1.2–2.2)
Albumin: 4.9 g/dL (ref 3.9–4.9)
Alkaline Phosphatase: 66 IU/L (ref 44–121)
BUN/Creatinine Ratio: 25 (ref 12–28)
BUN: 25 mg/dL (ref 8–27)
Bilirubin Total: 0.3 mg/dL (ref 0.0–1.2)
CO2: 25 mmol/L (ref 20–29)
Calcium: 11 mg/dL — ABNORMAL HIGH (ref 8.7–10.3)
Chloride: 99 mmol/L (ref 96–106)
Creatinine, Ser: 1.02 mg/dL — ABNORMAL HIGH (ref 0.57–1.00)
Globulin, Total: 2.8 g/dL (ref 1.5–4.5)
Glucose: 114 mg/dL — ABNORMAL HIGH (ref 70–99)
Potassium: 5.3 mmol/L — ABNORMAL HIGH (ref 3.5–5.2)
Sodium: 139 mmol/L (ref 134–144)
Total Protein: 7.7 g/dL (ref 6.0–8.5)
eGFR: 61 mL/min/{1.73_m2} (ref >59)

## 2022-02-18 LAB — CBC WITH DIFFERENTIAL/PLATELET
Basophils Absolute: 0 10*3/uL (ref 0.0–0.2)
Basos: 1 %
EOS (ABSOLUTE): 0.1 10*3/uL (ref 0.0–0.4)
Eos: 1 %
Hematocrit: 41.3 % (ref 34.0–46.6)
Hemoglobin: 13.2 g/dL (ref 11.1–15.9)
Immature Grans (Abs): 0 10*3/uL (ref 0.0–0.1)
Immature Granulocytes: 0 %
Lymphocytes Absolute: 2.1 10*3/uL (ref 0.7–3.1)
Lymphs: 44 %
MCH: 28.1 pg (ref 26.6–33.0)
MCHC: 32 g/dL (ref 31.5–35.7)
MCV: 88 fL (ref 79–97)
Monocytes Absolute: 0.3 10*3/uL (ref 0.1–0.9)
Monocytes: 7 %
Neutrophils Absolute: 2.2 10*3/uL (ref 1.4–7.0)
Neutrophils: 47 %
Platelets: 368 10*3/uL (ref 150–450)
RBC: 4.69 x10E6/uL (ref 3.77–5.28)
RDW: 13.9 % (ref 11.7–15.4)
WBC: 4.8 10*3/uL (ref 3.4–10.8)

## 2022-02-18 LAB — BAYER DCA HB A1C WAIVED: HB A1C (BAYER DCA - WAIVED): 5.7 % — ABNORMAL HIGH (ref 4.8–5.6)

## 2022-02-18 MED ORDER — SITAGLIPTIN PHOSPHATE 100 MG PO TABS: 100.0000 mg | ORAL_TABLET | Freq: Every day | ORAL | 1 refills | Status: DC

## 2022-02-18 MED ORDER — PRAVASTATIN SODIUM 40 MG PO TABS: ORAL_TABLET | ORAL | 1 refills | Status: DC

## 2022-02-18 MED ORDER — OLMESARTAN MEDOXOMIL 5 MG PO TABS: 5.0000 mg | ORAL_TABLET | Freq: Every day | ORAL | 1 refills | Status: DC

## 2022-02-18 MED ORDER — BLOOD PRESSURE KIT DEVI
1.0000 | Freq: Every day | 0 refills | Status: AC
Start: 2022-02-18 — End: ?

## 2022-02-18 MED ORDER — BLOOD PRESSURE KIT DEVI: 1.0000 | Freq: Every day | 0 refills | Status: DC

## 2022-02-18 MED ORDER — ACCU-CHEK GUIDE VI STRP
ORAL_STRIP | 0 refills | Status: DC
Start: 2022-02-18 — End: 2022-04-01

## 2022-02-18 MED ORDER — ONETOUCH DELICA LANCETS 33G MISC: 1 refills | Status: DC

## 2022-02-18 MED ORDER — OMEPRAZOLE 20 MG PO CPDR: DELAYED_RELEASE_CAPSULE | ORAL | 2 refills | Status: DC

## 2022-02-18 MED ORDER — METFORMIN HCL 500 MG PO TABS: 500.0000 mg | ORAL_TABLET | Freq: Every day | ORAL | 2 refills | Status: DC

## 2022-02-18 MED ORDER — EMPAGLIFLOZIN 10 MG PO TABS: ORAL_TABLET | ORAL | 1 refills | Status: DC

## 2022-02-18 NOTE — Progress Notes (Signed)
Subjective:    Patient ID: Shelley Wood, female    DOB: [birth year], 66 y.o.   MRN: [id number]  Chief Complaint  Patient presents with   Medical Management of Chronic Issues   Pt presents to the office today for chronic follow up. She has autism and development delay and  lives with her niece. She is doing a day program 5 days a day.  Hypertension This is a chronic problem. The current episode started more than 1 year ago. The problem has been resolved since onset. The problem is controlled. Pertinent negatives include no blurred vision, malaise/fatigue, peripheral edema or shortness of breath. Risk factors for coronary artery disease include dyslipidemia, obesity and sedentary lifestyle. The current treatment provides moderate improvement. There is no history of heart failure.  Diabetes She presents for her follow-up diabetic visit. She has type 2 diabetes mellitus. Pertinent negatives for diabetes include no blurred vision and no foot paresthesias. Symptoms are stable. Pertinent negatives for diabetic complications include no heart disease, nephropathy or peripheral neuropathy. Risk factors for coronary artery disease include dyslipidemia, diabetes mellitus, hypertension and sedentary lifestyle. She is following a generally healthy diet. Her overall blood glucose range is 110-130 mg/dl.  Hyperlipidemia This is a chronic problem. The current episode started more than 1 year ago. The problem is controlled. Recent lipid tests were reviewed and are normal. Pertinent negatives include no shortness of breath. Current antihyperlipidemic treatment includes statins. The current treatment provides moderate improvement of lipids. Risk factors for coronary artery disease include dyslipidemia, diabetes mellitus, hypertension and a sedentary lifestyle.  Gastroesophageal Reflux She complains of belching and heartburn. This is a chronic problem. The current episode started more than 1 year ago. The problem  occurs occasionally. She has tried a PPI for the symptoms. The treatment provided moderate relief.      Review of Systems  Constitutional:  Negative for malaise/fatigue.  Eyes:  Negative for blurred vision.  Respiratory:  Negative for shortness of breath.   Gastrointestinal:  Positive for heartburn.  All other systems reviewed and are negative.      Objective:   Physical Exam Vitals reviewed.  Constitutional:      General: She is not in acute distress.    Appearance: She is well-developed.  HENT:     Head: Normocephalic and atraumatic.     Right Ear: Tympanic membrane normal.     Left Ear: Tympanic membrane normal.  Eyes:     Pupils: Pupils are equal, round, and reactive to light.  Neck:     Thyroid: No thyromegaly.  Cardiovascular:     Rate and Rhythm: Normal rate and regular rhythm.     Heart sounds: Normal heart sounds. No murmur heard. Pulmonary:     Effort: Pulmonary effort is normal. No respiratory distress.     Breath sounds: Normal breath sounds. No wheezing.  Abdominal:     General: Bowel sounds are normal. There is no distension.     Palpations: Abdomen is soft.     Tenderness: There is no abdominal tenderness.  Musculoskeletal:        General: No tenderness. Normal range of motion.     Cervical back: Normal range of motion and neck supple.  Skin:    General: Skin is warm and dry.  Neurological:     Mental Status: She is alert and oriented to person, place, and time.     Cranial Nerves: No cranial nerve deficit.     Deep Tendon Reflexes: Reflexes are normal  and symmetric.  Psychiatric:        Behavior: Behavior normal.        Thought Content: Thought content normal.        Judgment: Judgment normal.       BP (!) 89/55   Pulse 83   Temp 97.6 F (36.4 C) (Temporal)   Ht _0  (1.397 m)   Wt 106 lb 12.8 oz (48.4 kg)   SpO2 100%   BMI 24.82 kg/m      Assessment & Plan:  Shelley Wood comes in today with chief complaint of Medical Management of  Chronic Issues   Diagnosis and orders addressed:  1. Hyperlipidemia, unspecified hyperlipidemia type - pravastatin (PRAVACHOL) 40 MG tablet; TAKE 1 TABLET(40 MG) BY MOUTH DAILY  Dispense: 90 tablet; Refill: 1 - Cologuard - CMP14+EGFR - CBC with Differential/Platelet  2. Hypertension associated with diabetes ([place]) Will stop Norvasc 10 mg today Will decrease Benicar to 5 mg from 40 mg  RTO in 2-4 weeks to recheck  - olmesartan (BENICAR) 5 MG tablet; Take 1 tablet (5 mg total) by mouth daily.  Dispense: 90 tablet; Refill: 1 - CMP14+EGFR - CBC with Differential/Platelet - Blood Pressure Monitoring (BLOOD PRESSURE KIT) DEVI; 1 Device by Does not apply route daily.  Dispense: 1 each; Refill: 0  3. Type 2 diabetes mellitus with other specified complication, without long-term current use of insulin (HCC) - sitaGLIPtin (JANUVIA) 100 MG tablet; Take 1 tablet (100 mg total) by mouth daily.  Dispense: 90 tablet; Refill: 1 - empagliflozin (JARDIANCE) 10 MG TABS tablet; TAKE 1 TABLET(10 MG) BY MOUTH DAILY  Dispense: 90 tablet; Refill: 1 - metFORMIN (GLUCOPHAGE) 500 MG tablet; Take 1 tablet (500 mg total) by mouth daily with breakfast. (Needs to be seen before next refill)  Dispense: 180 tablet; Refill: 2 - OneTouch Delica Lancets 28B MISC; Check BS BID and prn  Dispense: 100 each; Refill: 1 - glucose blood (ACCU-CHEK GUIDE) test strip; Check BS up to 4 times daily Dx E11.9  Dispense: 100 strip; Refill: 0 - Ambulatory referral to Ophthalmology - Bayer DCA Hb A1c Waived - olmesartan (BENICAR) 5 MG tablet; Take 1 tablet (5 mg total) by mouth daily.  Dispense: 90 tablet; Refill: 1 - Microalbumin / creatinine urine ratio - CMP14+EGFR - CBC with Differential/Platelet  4. Autism - CMP14+EGFR - CBC with Differential/Platelet  5. Development delay - CMP14+EGFR - CBC with Differential/Platelet  6. Colon cancer screening - CMP14+EGFR - CBC with Differential/Platelet  7. Gastroesophageal reflux  disease without esophagitis  - omeprazole (PRILOSEC) 20 MG capsule; TAKE 1 CAPSULE(20 MG) BY MOUTH DAILY  Dispense: 90 capsule; Refill: 2 - CMP14+EGFR - CBC with Differential/Platelet   8. Hypotension, unspecified hypotension type Will stop Norvasc 10 mg today Will decrease Benicar to 5 mg from 40 mg  Follow up in 2-4 weeks   Labs pending Health Maintenance reviewed Diet and exercise encouraged  Follow up plan: 2-4 weeks to recheck Hypotension   Evelina Dun, FNP

## 2022-02-18 NOTE — Patient Instructions (Signed)

## 2022-02-19 ENCOUNTER — Other Ambulatory Visit: Payer: Self-pay | Admitting: *Deleted

## 2022-02-19 LAB — MICROALBUMIN / CREATININE URINE RATIO
Creatinine, Urine: 48.6 mg/dL
Microalb/Creat Ratio: <6 mg/g creat (ref 0–29)
Microalbumin, Urine: <3 ug/mL

## 2022-02-20 ENCOUNTER — Other Ambulatory Visit: Payer: Self-pay | Admitting: Family

## 2022-02-20 DIAGNOSIS — E785 Hyperlipidemia, unspecified: Secondary | ICD-10-CM

## 2022-03-04 ENCOUNTER — Ambulatory Visit: Payer: Medicare Other | Admitting: Family

## 2022-04-01 ENCOUNTER — Other Ambulatory Visit: Payer: Self-pay | Admitting: Family

## 2022-04-01 DIAGNOSIS — E1169 Type 2 diabetes mellitus with other specified complication: Secondary | ICD-10-CM

## 2022-04-18 DIAGNOSIS — Z1211 Encounter for screening for malignant neoplasm of colon: Secondary | ICD-10-CM | POA: Diagnosis not present

## 2022-04-25 LAB — COLOGUARD: COLOGUARD: NEGATIVE

## 2022-05-15 ENCOUNTER — Other Ambulatory Visit: Payer: Self-pay | Admitting: Family

## 2022-05-15 DIAGNOSIS — E1169 Type 2 diabetes mellitus with other specified complication: Secondary | ICD-10-CM

## 2022-05-17 DIAGNOSIS — Z1231 Encounter for screening mammogram for malignant neoplasm of breast: Secondary | ICD-10-CM | POA: Diagnosis not present

## 2022-05-20 ENCOUNTER — Other Ambulatory Visit: Payer: Self-pay | Admitting: Family

## 2022-05-20 DIAGNOSIS — E785 Hyperlipidemia, unspecified: Secondary | ICD-10-CM

## 2022-07-16 ENCOUNTER — Encounter: Payer: Self-pay | Admitting: Family

## 2022-07-19 ENCOUNTER — Other Ambulatory Visit: Payer: Self-pay | Admitting: Family

## 2022-07-19 DIAGNOSIS — E785 Hyperlipidemia, unspecified: Secondary | ICD-10-CM

## 2022-09-02 ENCOUNTER — Other Ambulatory Visit: Payer: Self-pay | Admitting: Family

## 2022-09-02 ENCOUNTER — Encounter: Payer: Self-pay | Admitting: Family

## 2022-09-02 DIAGNOSIS — E785 Hyperlipidemia, unspecified: Secondary | ICD-10-CM

## 2022-09-02 NOTE — Telephone Encounter (Signed)
LMTCB TO SCHEDULE APPT LETTER MAILED

## 2022-09-02 NOTE — Telephone Encounter (Signed)
[facility] NTBS 30 days given 07/21/22

## 2022-09-04 ENCOUNTER — Other Ambulatory Visit: Payer: Self-pay

## 2022-09-04 DIAGNOSIS — R928 Other abnormal and inconclusive findings on diagnostic imaging of breast: Secondary | ICD-10-CM

## 2022-09-04 DIAGNOSIS — N6489 Other specified disorders of breast: Secondary | ICD-10-CM

## 2022-09-05 ENCOUNTER — Other Ambulatory Visit: Payer: Self-pay | Admitting: Family

## 2022-09-05 DIAGNOSIS — E785 Hyperlipidemia, unspecified: Secondary | ICD-10-CM

## 2022-09-05 DIAGNOSIS — N6489 Other specified disorders of breast: Secondary | ICD-10-CM | POA: Diagnosis not present

## 2022-09-05 DIAGNOSIS — R92321 Mammographic fibroglandular density, right breast: Secondary | ICD-10-CM | POA: Diagnosis not present

## 2022-09-05 DIAGNOSIS — I152 Hypertension secondary to endocrine disorders: Secondary | ICD-10-CM

## 2022-09-05 DIAGNOSIS — E1169 Type 2 diabetes mellitus with other specified complication: Secondary | ICD-10-CM

## 2022-09-05 DIAGNOSIS — R928 Other abnormal and inconclusive findings on diagnostic imaging of breast: Secondary | ICD-10-CM | POA: Diagnosis not present

## 2022-09-05 LAB — HM MAMMOGRAPHY

## 2022-09-05 NOTE — Telephone Encounter (Signed)
[facility] NTBS 30 days given 07/21/22

## 2022-09-08 ENCOUNTER — Encounter: Payer: Self-pay | Admitting: Family

## 2022-09-08 ENCOUNTER — Telehealth: Payer: Self-pay | Admitting: Family

## 2022-09-08 DIAGNOSIS — E1169 Type 2 diabetes mellitus with other specified complication: Secondary | ICD-10-CM

## 2022-09-08 DIAGNOSIS — E785 Hyperlipidemia, unspecified: Secondary | ICD-10-CM

## 2022-09-08 DIAGNOSIS — I152 Hypertension secondary to endocrine disorders: Secondary | ICD-10-CM

## 2022-09-08 DIAGNOSIS — K219 Gastro-esophageal reflux disease without esophagitis: Secondary | ICD-10-CM

## 2022-09-08 MED ORDER — EMPAGLIFLOZIN 10 MG PO TABS: ORAL_TABLET | ORAL | 0 refills | Status: DC

## 2022-09-08 MED ORDER — OLMESARTAN MEDOXOMIL 5 MG PO TABS: 5.0000 mg | ORAL_TABLET | Freq: Every day | ORAL | 0 refills | Status: DC

## 2022-09-08 MED ORDER — METFORMIN HCL 500 MG PO TABS: 500.0000 mg | ORAL_TABLET | Freq: Every day | ORAL | 0 refills | Status: DC

## 2022-09-08 MED ORDER — PRAVASTATIN SODIUM 40 MG PO TABS: ORAL_TABLET | ORAL | 0 refills | Status: DC

## 2022-09-08 MED ORDER — OMEPRAZOLE 20 MG PO CPDR: DELAYED_RELEASE_CAPSULE | ORAL | 0 refills | Status: DC

## 2022-09-08 NOTE — Telephone Encounter (Signed)
Pt has appt on 10/07/2022

## 2022-09-08 NOTE — Telephone Encounter (Signed)
  Prescription Request  09/08/2022  Is this a "Controlled Substance" medicine? no  Have you seen your PCP in the last 2 weeks? no  If YES, route message to pool  -  If NO, patient needs to be scheduled for appointment.  What is the name of the medication or equipment? metFORMIN (GLUCOPHAGE) 500 MG tablet   olmesartan (BENICAR) 5 MG tablet [id number]  pravastatin (PRAVACHOL) 40 MG tablet  omeprazole (PRILOSEC) 20 MG capsule [id number]  Jardiance 22m   Have you contacted your pharmacy to request a refill? yes   Which pharmacy would you like this sent to? [id number] [id number] [place] [id number]   Patient notified that their request is being sent to the clinical staff for review and that they should receive a response within 2 business days.

## 2022-09-08 NOTE — Telephone Encounter (Signed)
Refills sent to pharmacy, patient aware. 

## 2022-09-21 ENCOUNTER — Other Ambulatory Visit: Payer: Self-pay | Admitting: Family

## 2022-09-21 DIAGNOSIS — E1169 Type 2 diabetes mellitus with other specified complication: Secondary | ICD-10-CM

## 2022-09-21 DIAGNOSIS — E785 Hyperlipidemia, unspecified: Secondary | ICD-10-CM

## 2022-09-21 DIAGNOSIS — I152 Hypertension secondary to endocrine disorders: Secondary | ICD-10-CM

## 2022-09-29 IMAGING — MG DIGITAL SCREENING BILAT W/ CAD
5 series · 5 of 5 positions shown · non-contrast
Comparison: Previous exam(s).

CLINICAL DATA: Screening.

EXAM:
DIGITAL SCREENING BILATERAL MAMMOGRAM WITH CAD
TECHNIQUE: Bilateral screening digital craniocaudal and mediolateral oblique
mammograms were obtained. The images were evaluated with
computer-aided detection.

[L MLO (1 of 2)]
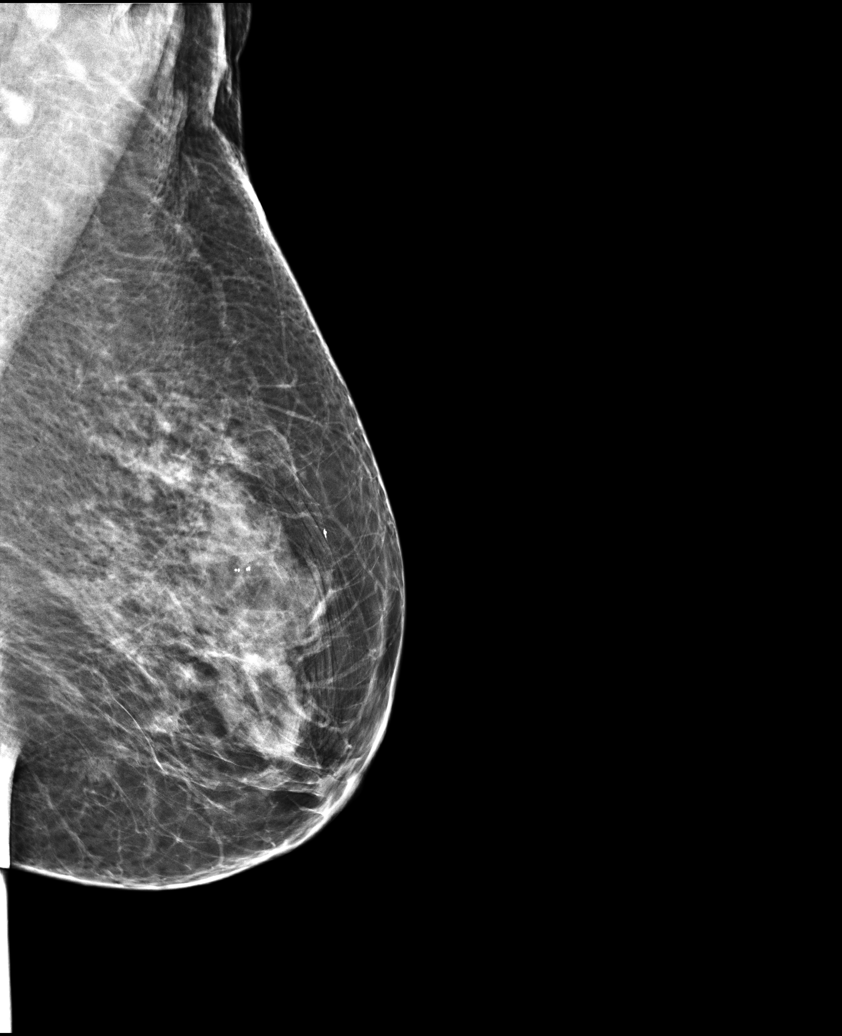

[R CC]
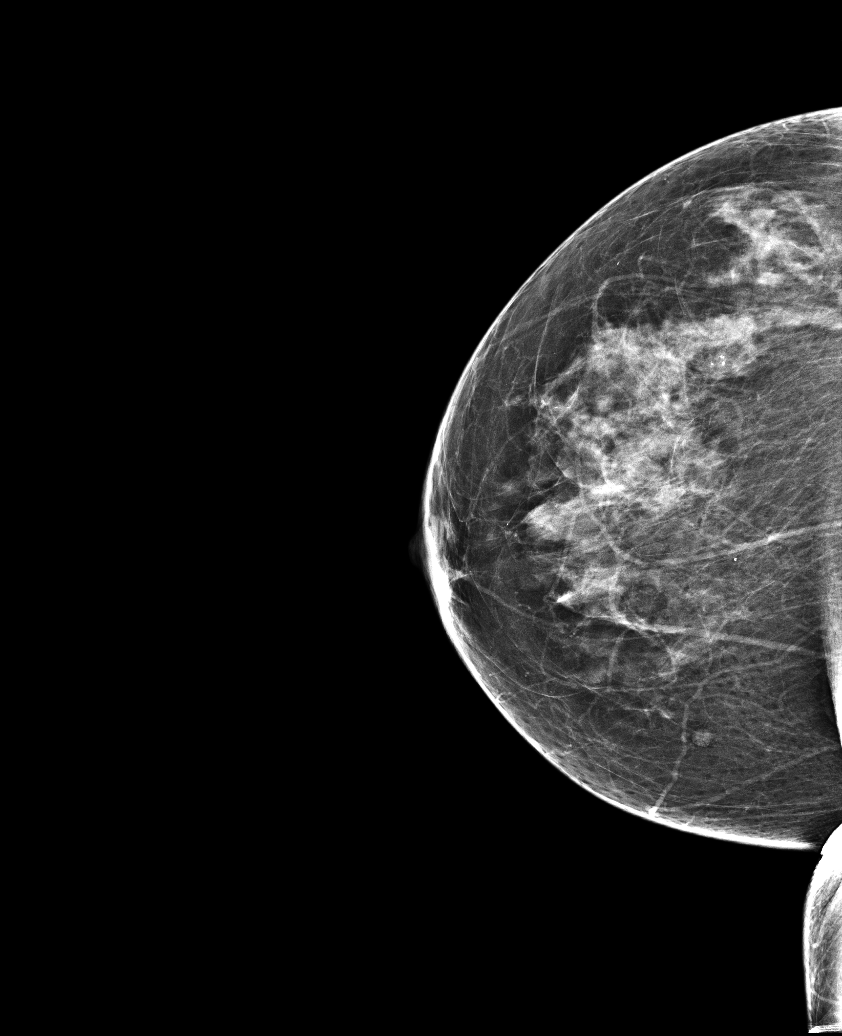

[R MLO]
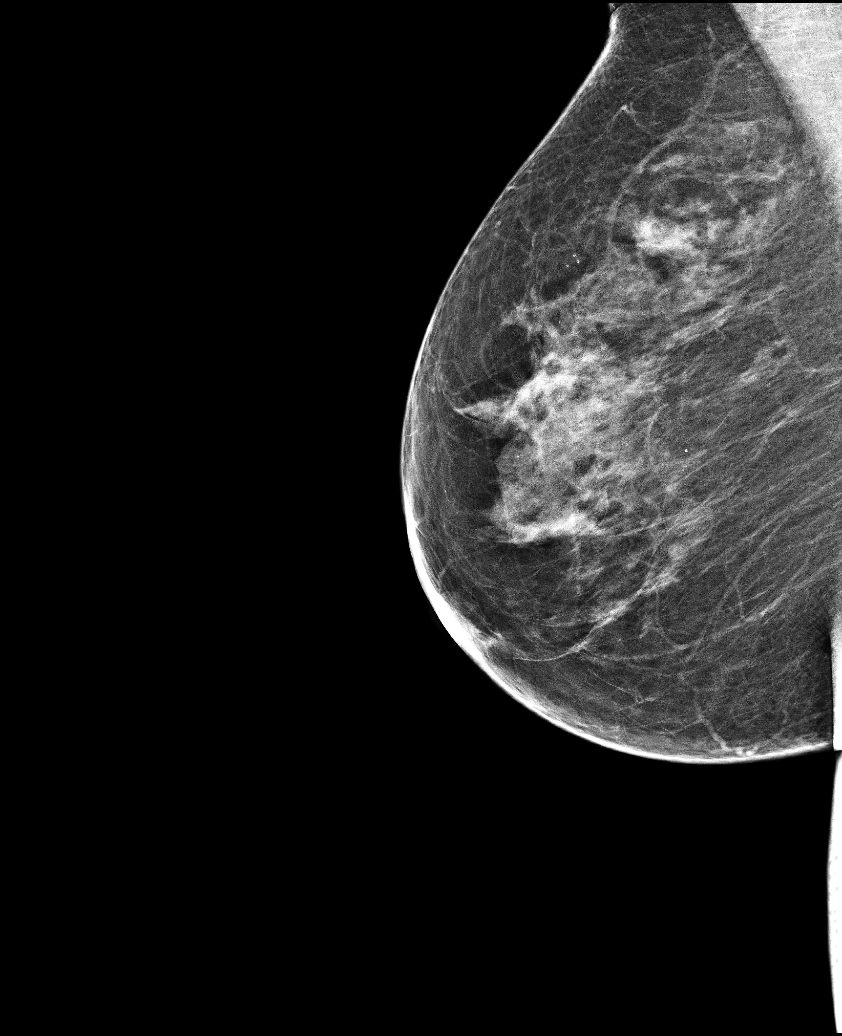

[L CC]
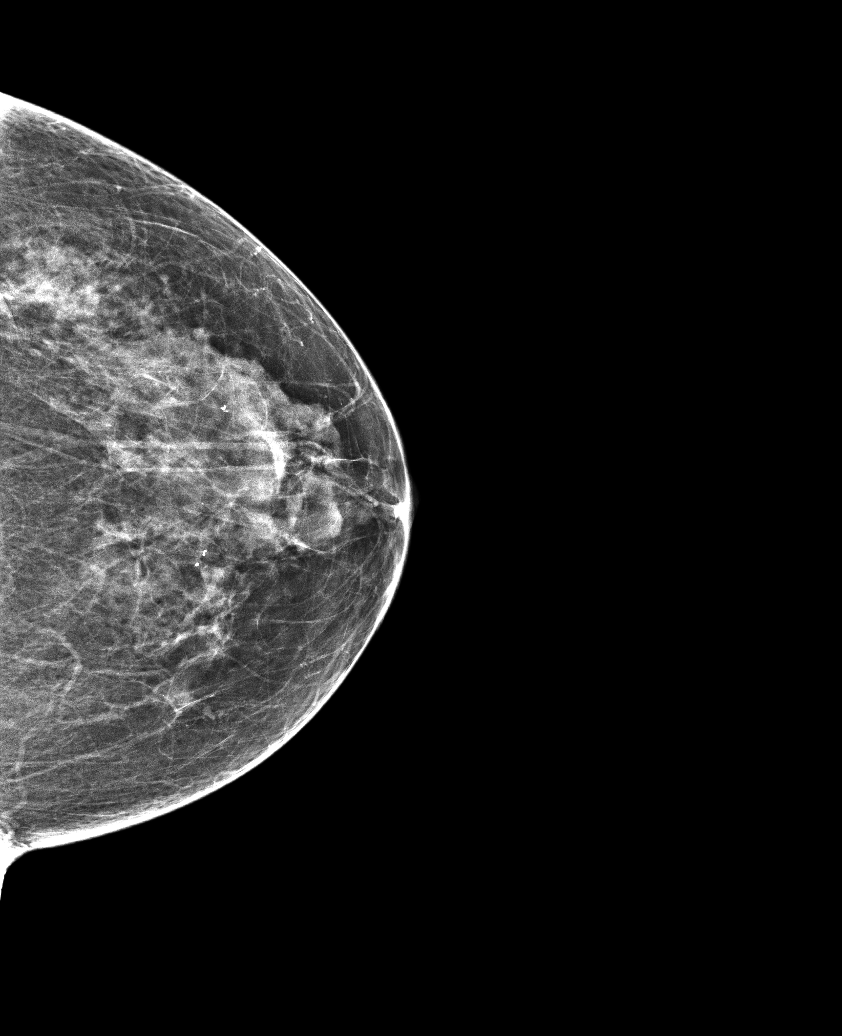

[L MLO (2 of 2)]
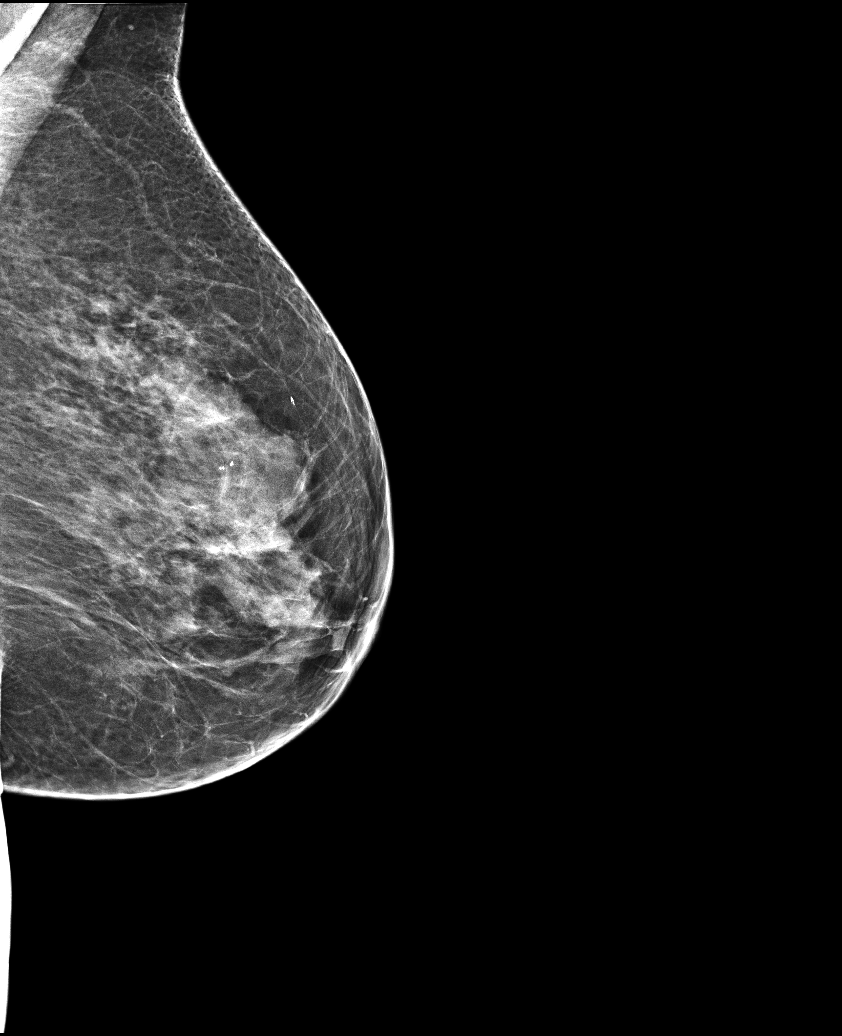

[5 of 5 positions shown; findings below may reference images not displayed]

ACR Breast Density Category c: The breast tissue is heterogeneously
dense, which may obscure small masses.
FINDINGS: There are no findings suspicious for malignancy.
IMPRESSION: No mammographic evidence of malignancy. A result letter of this
screening mammogram will be mailed directly to the patient.

RECOMMENDATION:
Screening mammogram in one year. (Code:TJ-B-ZPA)

BI-RADS CATEGORY  1: Negative.

## 2022-09-30 ENCOUNTER — Encounter: Payer: Self-pay | Admitting: Family

## 2022-10-07 ENCOUNTER — Encounter: Payer: Self-pay | Admitting: Family

## 2022-10-07 ENCOUNTER — Ambulatory Visit (INDEPENDENT_AMBULATORY_CARE_PROVIDER_SITE_OTHER): Payer: 59 | Admitting: Family

## 2022-10-07 VITALS — BP 124/63 | HR 78 | Temp 97.3°F | Ht <= 58 in | Wt 102.4 lb

## 2022-10-07 DIAGNOSIS — K219 Gastro-esophageal reflux disease without esophagitis: Secondary | ICD-10-CM | POA: Diagnosis not present

## 2022-10-07 DIAGNOSIS — E785 Hyperlipidemia, unspecified: Secondary | ICD-10-CM

## 2022-10-07 DIAGNOSIS — E1159 Type 2 diabetes mellitus with other circulatory complications: Secondary | ICD-10-CM | POA: Diagnosis not present

## 2022-10-07 DIAGNOSIS — Z0001 Encounter for general adult medical examination with abnormal findings: Secondary | ICD-10-CM

## 2022-10-07 DIAGNOSIS — E1169 Type 2 diabetes mellitus with other specified complication: Secondary | ICD-10-CM | POA: Diagnosis not present

## 2022-10-07 DIAGNOSIS — I152 Hypertension secondary to endocrine disorders: Secondary | ICD-10-CM

## 2022-10-07 DIAGNOSIS — Z0000 Encounter for general adult medical examination without abnormal findings: Secondary | ICD-10-CM | POA: Diagnosis not present

## 2022-10-07 LAB — BAYER DCA HB A1C WAIVED: HB A1C (BAYER DCA - WAIVED): 6.1 % — ABNORMAL HIGH (ref 4.8–5.6)

## 2022-10-07 MED ORDER — PRAVASTATIN SODIUM 40 MG PO TABS: ORAL_TABLET | ORAL | 2 refills | Status: DC

## 2022-10-07 MED ORDER — EMPAGLIFLOZIN 10 MG PO TABS: ORAL_TABLET | ORAL | 0 refills | Status: DC

## 2022-10-07 MED ORDER — OMEPRAZOLE 20 MG PO CPDR: DELAYED_RELEASE_CAPSULE | ORAL | 0 refills | Status: DC

## 2022-10-07 MED ORDER — OLMESARTAN MEDOXOMIL 5 MG PO TABS: ORAL_TABLET | ORAL | 2 refills | Status: DC

## 2022-10-07 MED ORDER — METFORMIN HCL 500 MG PO TABS: 500.0000 mg | ORAL_TABLET | Freq: Every day | ORAL | 2 refills | Status: DC

## 2022-10-07 NOTE — Patient Instructions (Signed)

## 2022-10-07 NOTE — Progress Notes (Signed)
Subjective:    Patient ID: Shelley Wood, female    DOB: 08-16-1955, 67 y.o.   MRN: [id number]  Chief Complaint  Patient presents with   Medical Management of Chronic Issues    TALKING TO HER SELF MORE THAN NORMAL.    Pt presents to the office today for CPE and  chronic follow up. She has autism and development delay and  lives with her niece. She is doing a day program 5 days a day.   Hypertension This is a chronic problem. The current episode started more than 1 year ago. The problem has been resolved since onset. The problem is controlled. Pertinent negatives include no blurred vision, malaise/fatigue, peripheral edema or shortness of breath. Risk factors for coronary artery disease include dyslipidemia and diabetes mellitus. The current treatment provides moderate improvement.  Diabetes She presents for her follow-up diabetic visit. She has type 2 diabetes mellitus. Pertinent negatives for diabetes include no blurred vision and no foot paresthesias. Risk factors for coronary artery disease include dyslipidemia, diabetes mellitus, hypertension, sedentary lifestyle and post-menopausal. She is following a generally healthy diet. Her overall blood glucose range is 110-130 mg/dl. Eye exam is not current.  Hyperlipidemia This is a chronic problem. The current episode started more than 1 year ago. The problem is controlled. Recent lipid tests were reviewed and are normal. Pertinent negatives include no shortness of breath. Current antihyperlipidemic treatment includes statins. The current treatment provides moderate improvement of lipids. Risk factors for coronary artery disease include dyslipidemia, diabetes mellitus, hypertension, a sedentary lifestyle and post-menopausal.      Review of Systems  Constitutional:  Negative for malaise/fatigue.  Eyes:  Negative for blurred vision.  Respiratory:  Negative for shortness of breath.   All other systems reviewed and are negative.  Family History   Problem Relation Age of Onset   Arthritis Mother    Heart disease Mother    Hypertension Mother    Stroke Mother    Alcohol abuse Father    Arthritis Father    Early death Father    Heart disease Father    Arthritis Sister    Birth defects Sister    Cancer Sister    COPD Sister    Hyperlipidemia Sister    Hypertension Sister    Arthritis Brother    Hyperlipidemia Brother    Hypertension Brother    Arthritis Maternal Grandmother    Arthritis Maternal Grandfather    Arthritis Brother    Arthritis Sister    Cancer Sister    Arthritis Sister    Learning disabilities Sister    Mental retardation Sister    Arthritis Sister    Arthritis Sister    Social History   Socioeconomic History   Marital status: Single    Spouse name: Not on file   Number of children: Not on file   Years of education: Not on file   Highest education level: Not on file  Occupational History   Occupation: Disabled  Tobacco Use   Smoking status: Never   Smokeless tobacco: Never  Vaping Use   Vaping Use: Never used  Substance and Sexual Activity   Alcohol use: No   Drug use: No   Sexual activity: Never  Other Topics Concern   Not on file  Social History Narrative   She is mentally challenged; Lives with her sister, has a caretaker weekdays 8-5   Social Determinants of Health   Financial Resource Strain: Low Risk  (12/30/2021)   Overall Financial  Resource Strain (CARDIA)    Difficulty of Paying Living Expenses: Not hard at all  Food Insecurity: No Food Insecurity (12/30/2021)   Hunger Vital Sign    Worried About Running Out of Food in the Last Year: Never true    Ran Out of Food in the Last Year: Never true  Transportation Needs: No Transportation Needs (12/30/2021)   PRAPARE - Hydrologist (Medical): No    Lack of Transportation (Non-Medical): No  Physical Activity: Sufficiently Active (12/30/2021)   Exercise Vital Sign    Days of Exercise per Week: 3 days     Minutes of Exercise per Session: 60 min  Stress: No Stress Concern Present (12/30/2021)   [place]    Feeling of Stress : Not at all  Social Connections: Moderately Integrated (12/30/2021)   Social Connection and Isolation Panel [NHANES]    Frequency of Communication with Friends and Family: More than three times a week    Frequency of Social Gatherings with Friends and Family: More than three times a week    Attends Religious Services: More than 4 times per year    Active Member of [employer] or Organizations: Yes    Attends Music therapist: More than 4 times per year    Marital Status: Never married       Objective:   Physical Exam Vitals reviewed.  Constitutional:      General: She is not in acute distress.    Appearance: She is well-developed.  HENT:     Head: Normocephalic and atraumatic.     Right Ear: Tympanic membrane normal.     Left Ear: Tympanic membrane normal.  Eyes:     Pupils: Pupils are equal, round, and reactive to light.  Neck:     Thyroid: No thyromegaly.  Cardiovascular:     Rate and Rhythm: Normal rate and regular rhythm.     Heart sounds: Normal heart sounds. No murmur heard. Pulmonary:     Effort: Pulmonary effort is normal. No respiratory distress.     Breath sounds: Normal breath sounds. No wheezing.  Abdominal:     General: Bowel sounds are normal. There is no distension.     Palpations: Abdomen is soft.     Tenderness: There is no abdominal tenderness.  Musculoskeletal:        General: No tenderness. Normal range of motion.     Cervical back: Normal range of motion and neck supple.  Skin:    General: Skin is warm and dry.  Neurological:     Mental Status: She is alert and oriented to person, place, and time.     Cranial Nerves: No cranial nerve deficit.     Deep Tendon Reflexes: Reflexes are normal and symmetric.  Psychiatric:        Behavior: Behavior normal.         Thought Content: Thought content normal.        Judgment: Judgment normal.       BP 124/63   Pulse 78   Temp (!) 97.3 F (36.3 C) (Temporal)   Ht 4\' 7"  (1.397 m)   Wt 102 lb 6.4 oz (46.4 kg)   SpO2 100%   BMI 23.80 kg/m      Assessment & Plan:  Shelley Wood comes in today with chief complaint of Medical Management of Chronic Issues (TALKING TO HER SELF MORE THAN NORMAL. )   Diagnosis and orders addressed:  1. Hypertension associated with diabetes ([place]) - olmesartan (BENICAR) 5 MG tablet; TAKE 1 TABLET (5 MG) BY MOUTH DAILY  Dispense: 90 tablet; Refill: 2 - CMP14+EGFR - CBC with Differential/Platelet  2. Type 2 diabetes mellitus with other specified complication, without long-term current use of insulin (HCC) - empagliflozin (JARDIANCE) 10 MG TABS tablet; TAKE 1 TABLET (10 MG) BY MOUTH DAILY  Dispense: 90 tablet; Refill: 0 - metFORMIN (GLUCOPHAGE) 500 MG tablet; Take 1 tablet (500 mg total) by mouth daily with breakfast. (Needs to be seen before next refill)  Dispense: 90 tablet; Refill: 2 - olmesartan (BENICAR) 5 MG tablet; TAKE 1 TABLET (5 MG) BY MOUTH DAILY  Dispense: 90 tablet; Refill: 2 - Bayer DCA Hb A1c Waived - CMP14+EGFR - CBC with Differential/Platelet  3. Gastroesophageal reflux disease without esophagitis - omeprazole (PRILOSEC) 20 MG capsule; TAKE 1 CAPSULE(20 MG) BY MOUTH DAILY (NEEDS TO BE SEEN BEFORE NEXT REFILL)  Dispense: 30 capsule; Refill: 0 - CMP14+EGFR - CBC with Differential/Platelet  4. Hyperlipidemia, unspecified hyperlipidemia type - pravastatin (PRAVACHOL) 40 MG tablet; TAKE 1 TABLET (40 MG) BY MOUTH DAILY  Dispense: 90 tablet; Refill: 2 - CMP14+EGFR - CBC with Differential/Platelet - Lipid panel  5. Annual physical exam  - Bayer DCA Hb A1c Waived - CMP14+EGFR - CBC with Differential/Platelet - Lipid panel - TSH   Labs pending Health Maintenance reviewed Diet and exercise encouraged  Follow up plan: 6 months    Evelina Dun,  FNP

## 2022-10-08 LAB — CMP14+EGFR
ALT: 18 IU/L (ref 0–32)
AST: 9 IU/L (ref 0–40)
Albumin/Globulin Ratio: 1.9 (ref 1.2–2.2)
Albumin: 4.2 g/dL (ref 3.9–4.9)
Alkaline Phosphatase: 62 IU/L (ref 44–121)
BUN/Creatinine Ratio: 18 (ref 12–28)
BUN: 15 mg/dL (ref 8–27)
Bilirubin Total: 0.3 mg/dL (ref 0.0–1.2)
CO2: 23 mmol/L (ref 20–29)
Calcium: 10.3 mg/dL (ref 8.7–10.3)
Chloride: 104 mmol/L (ref 96–106)
Creatinine, Ser: 0.85 mg/dL (ref 0.57–1.00)
Globulin, Total: 2.2 g/dL (ref 1.5–4.5)
Glucose: 97 mg/dL (ref 70–99)
Potassium: 4.7 mmol/L (ref 3.5–5.2)
Sodium: 143 mmol/L (ref 134–144)
Total Protein: 6.4 g/dL (ref 6.0–8.5)
eGFR: 76 mL/min/{1.73_m2} (ref >59)

## 2022-10-08 LAB — CBC WITH DIFFERENTIAL/PLATELET
Basophils Absolute: 0 10*3/uL (ref 0.0–0.2)
Basos: 1 %
EOS (ABSOLUTE): 0.1 10*3/uL (ref 0.0–0.4)
Eos: 2 %
Hematocrit: 38.7 % (ref 34.0–46.6)
Hemoglobin: 12.4 g/dL (ref 11.1–15.9)
Immature Grans (Abs): 0 10*3/uL (ref 0.0–0.1)
Immature Granulocytes: 0 %
Lymphocytes Absolute: 2.4 10*3/uL (ref 0.7–3.1)
Lymphs: 54 %
MCH: 28.9 pg (ref 26.6–33.0)
MCHC: 32 g/dL (ref 31.5–35.7)
MCV: 90 fL (ref 79–97)
Monocytes Absolute: 0.3 10*3/uL (ref 0.1–0.9)
Monocytes: 8 %
Neutrophils Absolute: 1.6 10*3/uL (ref 1.4–7.0)
Neutrophils: 35 %
Platelets: 324 10*3/uL (ref 150–450)
RBC: 4.29 x10E6/uL (ref 3.77–5.28)
RDW: 13.5 % (ref 11.7–15.4)
WBC: 4.4 10*3/uL (ref 3.4–10.8)

## 2022-10-08 LAB — LIPID PANEL
Chol/HDL Ratio: 3.2 ratio (ref 0.0–4.4)
Cholesterol, Total: 134 mg/dL (ref 100–199)
HDL: 42 mg/dL (ref >39)
LDL Chol Calc (NIH): 79 mg/dL (ref 0–99)
Triglycerides: 60 mg/dL (ref 0–149)
VLDL Cholesterol Cal: 13 mg/dL (ref 5–40)

## 2022-10-08 LAB — TSH: TSH: 1.61 u[IU]/mL (ref 0.450–4.500)

## 2022-10-09 NOTE — Progress Notes (Signed)
R/c

## 2022-10-20 ENCOUNTER — Other Ambulatory Visit: Payer: Self-pay | Admitting: Family

## 2022-10-20 DIAGNOSIS — K219 Gastro-esophageal reflux disease without esophagitis: Secondary | ICD-10-CM

## 2022-10-31 ENCOUNTER — Telehealth: Payer: Self-pay | Admitting: Family

## 2022-10-31 ENCOUNTER — Encounter: Payer: Self-pay | Admitting: Family Medicine

## 2022-10-31 NOTE — Telephone Encounter (Signed)
Nothing in chart do you know when last pap and hysterectomy was?

## 2022-10-31 NOTE — Telephone Encounter (Signed)
Lmtcb.

## 2022-10-31 NOTE — Telephone Encounter (Signed)
Pts caregiver called needed to know when pt had her last hysterectomy and PAP. Was it here or from a previous office? And when?

## 2022-10-31 NOTE — Telephone Encounter (Signed)
No pap here. She is overdue.

## 2022-11-04 NOTE — Telephone Encounter (Signed)
Attempted to call pt , no answer left vm for cb  

## 2022-11-04 NOTE — Telephone Encounter (Signed)
Pts caregiver called back. Made her aware of PCP notes. She voiced understanding.

## 2022-12-24 ENCOUNTER — Other Ambulatory Visit: Payer: Self-pay | Admitting: Family

## 2022-12-24 DIAGNOSIS — K219 Gastro-esophageal reflux disease without esophagitis: Secondary | ICD-10-CM

## 2023-01-01 ENCOUNTER — Ambulatory Visit (INDEPENDENT_AMBULATORY_CARE_PROVIDER_SITE_OTHER): Payer: 59

## 2023-01-01 VITALS — Ht <= 58 in | Wt 108.0 lb

## 2023-01-01 DIAGNOSIS — Z0000 Encounter for general adult medical examination without abnormal findings: Secondary | ICD-10-CM | POA: Diagnosis not present

## 2023-01-01 DIAGNOSIS — Z0100 Encounter for examination of eyes and vision without abnormal findings: Secondary | ICD-10-CM

## 2023-01-01 DIAGNOSIS — Z780 Asymptomatic menopausal state: Secondary | ICD-10-CM

## 2023-01-01 NOTE — Progress Notes (Signed)
Subjective:   Shelley Wood is a 68 y.o. female who presents for Medicare Annual (Subsequent) preventive examination.  Visit Complete: Virtual  I connected with  Shelley Wood on 01/01/23 by a audio enabled telemedicine application and verified that I am speaking with the correct person using two identifiers.  Patient Location: Home  Provider Location: Home Office  I discussed the limitations of evaluation and management by telemedicine. The patient expressed understanding and agreed to proceed.  Patient Medicare AWV questionnaire was completed by the patient on 01/01/2023; I have confirmed that all information answered by patient is correct and no changes since this date.  Review of Systems     Cardiac Risk Factors include: advanced age (>57men, >54 women);dyslipidemia     Objective:    Today's Vitals   01/01/23 1527  Weight: 108 lb (49 kg)  Height: 4\' 7"  (1.397 m)   Body mass index is 25.1 kg/m.     01/01/2023    3:33 PM 12/30/2021    3:42 PM 12/07/2020    1:34 PM 12/31/2018    3:50 PM  Advanced Directives  Does Patient Have a Medical Advance Directive? No No Yes No  Type of Engineer, production of Healthcare Power of Attorney in Chart?   No - copy requested   Would patient like information on creating a medical advance directive? No - Patient declined No - Patient declined  Yes (MAU/Ambulatory/Procedural Areas - Information given)    Current Medications (verified) Outpatient Encounter Medications as of 01/01/2023  Medication Sig   blood glucose meter kit and supplies Dispense based on patient and insurance preference. Use up to four times daily as directed. (FOR ICD-10 E10.9, E11.9).   Blood Glucose Monitoring Suppl (ONE TOUCH ULTRA 2) w/Device KIT Test BS QID Dx E11.9   Blood Pressure Monitoring (BLOOD PRESSURE KIT) DEVI 1 Device by Does not apply route daily.   empagliflozin (JARDIANCE) 10 MG TABS tablet TAKE 1 TABLET (10 MG) BY  MOUTH DAILY   glucose blood (ONETOUCH ULTRA) test strip Check BS up to 4 times daily Dx E11.9   Lancets (ONETOUCH DELICA PLUS LANCET33G) MISC  Test BS QID Dx E11.9   metFORMIN (GLUCOPHAGE) 500 MG tablet Take 1 tablet (500 mg total) by mouth daily with breakfast. (Needs to be seen before next refill)   olmesartan (BENICAR) 5 MG tablet TAKE 1 TABLET (5 MG) BY MOUTH DAILY   omeprazole (PRILOSEC) 20 MG capsule TAKE 1 CAPSULE(20 MG) BY MOUTH DAILY   pravastatin (PRAVACHOL) 40 MG tablet TAKE 1 TABLET (40 MG) BY MOUTH DAILY   No facility-administered encounter medications on file as of 01/01/2023.    Allergies (verified) Patient has no known allergies.   History: Past Medical History:  Diagnosis Date   Allergy    Cataract    Depression    Diabetes mellitus without complication (HCC)    Emphysema of lung (HCC)    GERD (gastroesophageal reflux disease)    Heart murmur    Hyperlipidemia    Hypertension    History reviewed. No pertinent surgical history. Family History  Problem Relation Age of Onset   Arthritis Mother    Heart disease Mother    Hypertension Mother    Stroke Mother    Alcohol abuse Father    Arthritis Father    Early death Father    Heart disease Father    Arthritis Sister    Birth defects Sister    Cancer  Sister    COPD Sister    Hyperlipidemia Sister    Hypertension Sister    Arthritis Brother    Hyperlipidemia Brother    Hypertension Brother    Arthritis Maternal Grandmother    Arthritis Maternal Grandfather    Arthritis Brother    Arthritis Sister    Cancer Sister    Arthritis Sister    Learning disabilities Sister    Mental retardation Sister    Arthritis Sister    Arthritis Sister    Social History   Socioeconomic History   Marital status: Single    Spouse name: Not on file   Number of children: Not on file   Years of education: Not on file   Highest education level: Not on file  Occupational History   Occupation: Disabled  Tobacco Use    Smoking status: Never   Smokeless tobacco: Never  Vaping Use   Vaping Use: Never used  Substance and Sexual Activity   Alcohol use: No   Drug use: No   Sexual activity: Never  Other Topics Concern   Not on file  Social History Narrative   She is mentally challenged; Lives with her sister, has a caretaker weekdays 8-5   Social Determinants of Health   Financial Resource Strain: Low Risk  (01/01/2023)   Overall Financial Resource Strain (CARDIA)    Difficulty of Paying Living Expenses: Not hard at all  Food Insecurity: No Food Insecurity (01/01/2023)   Hunger Vital Sign    Worried About Running Out of Food in the Last Year: Never true    Ran Out of Food in the Last Year: Never true  Transportation Needs: No Transportation Needs (01/01/2023)   PRAPARE - Administrator, Civil Service (Medical): No    Lack of Transportation (Non-Medical): No  Physical Activity: Insufficiently Active (01/01/2023)   Exercise Vital Sign    Days of Exercise per Week: 3 days    Minutes of Exercise per Session: 30 min  Stress: No Stress Concern Present (01/01/2023)   [employer] of Occupational Health - Occupational Stress Questionnaire    Feeling of Stress : Not at all  Social Connections: Moderately Integrated (01/01/2023)   Social Connection and Isolation Panel [NHANES]    Frequency of Communication with Friends and Family: More than three times a week    Frequency of Social Gatherings with Friends and Family: More than three times a week    Attends Religious Services: More than 4 times per year    Active Member of [employer] or Organizations: Yes    Attends Engineer, structural: More than 4 times per year    Marital Status: Never married    Tobacco Counseling Counseling given: Not Answered   Clinical Intake:  Pre-visit preparation completed: Yes  Pain : No/denies pain     Nutritional Risks: None Diabetes: No  How often do you need to have someone help you when you  read instructions, pamphlets, or other written materials from your doctor or pharmacy?: 1 - Never  Interpreter Needed?: No  Information entered by :: Shelley Ora, LPN   Activities of Daily Living    01/01/2023    3:33 PM  In your present state of health, do you have any difficulty performing the following activities:  Hearing? 0  Vision? 0  Difficulty concentrating or making decisions? 0  Walking or climbing stairs? 0  Dressing or bathing? 1  Doing errands, shopping? 1  Preparing Food and eating ? N  Using the Toilet? N  In the past six months, have you accidently leaked urine? N  Do you have problems with loss of bowel control? N  Managing your Medications? N  Managing your Finances? N  Housekeeping or managing your Housekeeping? N    Patient Care Team: Junie Spencer, FNP as PCP - General (Family Medicine) Michaelle Copas, MD as Referring Physician (Optometry) Adam Phenix, DPM as Consulting Physician (Podiatry)  Indicate any recent Medical Services you may have received from other than [facility] providers in the past year (date may be approximate).     Assessment:   This is a routine wellness examination for Shelley Wood.  Hearing/Vision screen Vision Screening - Comments:: Referral 01/01/2023  Dietary issues and exercise activities discussed:     Goals Addressed             This Visit's Progress    Exercise 3x per week (30 min per time)   On track    Continue weight watchers and exercise program       Depression Screen    01/01/2023    3:32 PM 10/07/2022    8:41 AM 10/07/2022    8:36 AM 12/30/2021    3:53 PM 08/06/2021    9:21 AM 12/07/2020    1:24 PM 11/14/2020   10:49 AM  PHQ 2/9 Scores  PHQ - 2 Score 0 0 0 0 0 0 0  PHQ- 9 Score  0 0        Fall Risk    01/01/2023    3:31 PM 10/07/2022    8:36 AM 12/30/2021    3:37 PM 08/06/2021    9:21 AM 12/07/2020    1:34 PM  Fall Risk   Falls in the past year? 0 0 0 0 0  Number falls in past yr: 0 0 0  0  Injury  with Fall? 0 0 0  0  Risk for fall due to : No Fall Risks No Fall Risks No Fall Risks  No Fall Risks  Follow up Falls prevention discussed Education provided Falls prevention discussed  Falls prevention discussed    MEDICARE RISK AT HOME:  Medicare Risk at Home - 01/01/23 1531     Any stairs in or around the home? Yes    If so, are there any without handrails? No    Home free of loose throw rugs in walkways, pet beds, electrical cords, etc? Yes    Adequate lighting in your home to reduce risk of falls? Yes    Life alert? No    Use of a cane, walker or w/c? No    Grab bars in the bathroom? Yes    Shower chair or bench in shower? Yes    Elevated toilet seat or a handicapped toilet? Yes             TIMED UP AND GO:  Was the test performed?  No    Cognitive Function:        01/01/2023    3:33 PM 12/30/2021    3:46 PM 12/31/2018    3:52 PM  6CIT Screen  What Year? 0 points 4 points 0 points  What month? 0 points 0 points 0 points  What time? 0 points 0 points 0 points  Count back from 20 0 points 4 points 2 points  Months in reverse 0 points 4 points 4 points  Repeat phrase 0 points 2 points 4 points  Total Score 0 points 14 points 10  points    Immunizations Immunization History  Administered Date(s) Administered   Influenza,inj,Quad PF,6+ Mos 06/09/2017, 06/15/2018, 09/15/2019, 08/06/2021   Moderna SARS-COV2 Booster Vaccination 03/12/2021   Moderna Sars-Covid-2 Vaccination 09/28/2019, 10/26/2019, 06/19/2020   Pneumococcal Polysaccharide-23 09/28/2020   Tdap 09/28/2020    TDAP status: Up to date  Flu Vaccine status: Up to date  Pneumococcal vaccine status: Up to date  Covid-19 vaccine status: Completed vaccines  Qualifies for Shingles Vaccine? Yes   Zostavax completed No   Shingrix Completed?: No.    Education has been provided regarding the importance of this vaccine. Patient has been advised to call insurance company to determine out of pocket expense if  they have not yet received this vaccine. Advised may also receive vaccine at local pharmacy or Health Dept. Verbalized acceptance and understanding.  Screening Tests Health Maintenance  Topic Date Due   OPHTHALMOLOGY EXAM  Never done   PAP SMEAR-Modifier  Never done   COVID-19 Vaccine (5 - 2023-24 season) 03/14/2022   Zoster Vaccines- Shingrix (1 of 2) 01/07/2023 (Originally 01/26/2008)   INFLUENZA VACCINE  02/12/2023   Diabetic kidney evaluation - Urine ACR  02/19/2023   HEMOGLOBIN A1C  04/09/2023   MAMMOGRAM  05/28/2023   Diabetic kidney evaluation - eGFR measurement  10/07/2023   FOOT EXAM  10/07/2023   Medicare Annual Wellness (AWV)  01/01/2024   Fecal DNA (Cologuard)  04/18/2025   DTaP/Tdap/Td (2 - Td or Tdap) 09/29/2030   Hepatitis C Screening  Completed   HIV Screening  Completed   HPV VACCINES  Aged Out    Health Maintenance  Health Maintenance Due  Topic Date Due   OPHTHALMOLOGY EXAM  Never done   PAP SMEAR-Modifier  Never done   COVID-19 Vaccine (5 - 2023-24 season) 03/14/2022    Colorectal cancer screening: Type of screening: Cologuard. Completed 04/18/2022. Repeat every 3 years  Mammogram status: Completed 05/27/2022. Repeat every year  Bone Density status: Ordered 01/01/2023  after 01/25/2022. Pt provided with contact info and advised to call to schedule appt.  Lung Cancer Screening: (Low Dose CT Chest recommended if Age 74-80 years, 20 pack-year currently smoking OR have quit w/in 15years.) does not qualify.   Lung Cancer Screening Referral: n/a  Additional Screening:  Hepatitis C Screening: does not qualify; Completed 01/10/2020  Vision Screening: Recommended annual ophthalmology exams for early detection of glaucoma and other disorders of the eye. Is the patient up to date with their annual eye exam?  No  Who is the provider or what is the name of the office in which the patient attends annual eye exams? None , referral 01/01/2023 If pt is not  established with a provider, would they like to be referred to a provider to establish care? No .   Dental Screening: Recommended annual dental exams for proper oral hygiene  Diabetic Foot Exam: Diabetic Foot Exam: Overdue, Pt has been advised about the importance in completing this exam. Pt is scheduled for diabetic foot exam on next office visit .  Community Resource Referral / Chronic Care Management: CRR required this visit?  No   CCM required this visit?  No     Plan:     I have personally reviewed and noted the following in the patient's chart:   Medical and social history Use of alcohol, tobacco or illicit drugs  Current medications and supplements including opioid prescriptions. Patient is not currently taking opioid prescriptions. Functional ability and status Nutritional status Physical activity Advanced directives List of other  physicians Hospitalizations, surgeries, and ER visits in previous 12 months Vitals Screenings to include cognitive, depression, and falls Referrals and appointments  In addition, I have reviewed and discussed with patient certain preventive protocols, quality metrics, and best practice recommendations. A written personalized care plan for preventive services as well as general preventive health recommendations were provided to patient.     Lorrene Reid, LPN   [id number]/[id number]   After Visit Summary: (MyChart) Due to this being a telephonic visit, the after visit summary with patients personalized plan was offered to patient via MyChart   Nurse Notes: none

## 2023-01-01 NOTE — Patient Instructions (Signed)
[name] , Thank you for taking time to come for your Medicare Wellness Visit. I appreciate your ongoing commitment to your health goals. Please review the following plan we discussed and let me know if I can assist you in the future.   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 Chronic Diagnoses of Type 2 DM, HLD, HTN Needs help from CNA in the home (Monday-Friday (9:00 AM to 5:00 PM)    Clinical Social Work Clinical Goal(s):  LCSW will talk with client/Shelley Wood, sister of client, in next 30 days about community resources of assistance to client  Interventions: Talked with Hulan Amato, sister of client, about CCM program support for client Encouraged for Shelley/client to talk with RNCM as needed for nursing support  Talked with Raynelle Fanning about relaxation activities of client (likes to watch TV, listens to music, likes to dance, likes to sing,likes to talk with family members via phone) Talked with Raynelle Fanning about social support network (sister) Talked with Raynelle Fanning about transport needs of client Talked with Raynelle Fanning about pain issues of client Talked with Raynelle Fanning about client management of Diabetes Talked with Raynelle Fanning about client completion of ADLs daily  Patient Self Care Activities:   Completes ADLs with assistance Communicates with sister, caregivers and family members Attends scheduled medical appointments Socializes with others  Patient Self Care Deficits: Autism Management of Diabetes Challenges  Initial goal documentation       Exercise 3x per week (30 min per time)      Continue weight watchers and exercise program        This is a list of the screening recommended for you and due dates:  Health Maintenance  Topic Date Due   Eye exam for diabetics  Never done   Pap Smear  Never done   COVID-19  Vaccine (5 - 2023-24 season) 03/14/2022   Zoster (Shingles) Vaccine (1 of 2) 01/07/2023*   Flu Shot  02/12/2023   Yearly kidney health urinalysis for diabetes  02/19/2023   Hemoglobin A1C  04/09/2023   Mammogram  05/28/2023   Yearly kidney function blood test for diabetes  10/07/2023   Complete foot exam   10/07/2023   Medicare Annual Wellness Visit  01/01/2024   Cologuard (Stool DNA test)  04/18/2025   DTaP/Tdap/Td vaccine (2 - Td or Tdap) 09/29/2030   Hepatitis C Screening  Completed   HIV Screening  Completed   HPV Vaccine  Aged Out  *Topic was postponed. The date shown is not the original due date.    Advanced directives: Advance directive discussed with you today. I have provided a copy for you to complete at home and have notarized. Once this is complete please bring a copy in to our office so we can scan it into your chart.   Conditions/risks identified: Aim for 30 minutes of exercise or brisk walking, 6-8 glasses of water, and 5 servings of fruits and vegetables each day.   Next appointment: Follow up in one year for your annual wellness visit.   Preventive Care 40-64 Years, Female Preventive care refers to lifestyle choices and visits with your health care provider that can promote health and wellness. What does preventive care include? A yearly physical exam. This is also called an annual well check.  Dental exams once or twice a year. Routine eye exams. Ask your health care provider how often you should have your eyes checked. Personal lifestyle choices, including: Daily care of your teeth and gums. Regular physical activity. Eating a healthy diet. Avoiding tobacco and drug use. Limiting alcohol use. Practicing safe sex. Taking low-dose aspirin daily starting at age 10. Taking vitamin and mineral supplements as recommended by your health care provider. What happens during an annual well check? The services and screenings done by your health care provider during your  annual well check will depend on your age, overall health, lifestyle risk factors, and family history of disease. Counseling  Your health care provider may ask you questions about your: Alcohol use. Tobacco use. Drug use. Emotional well-being. Home and relationship well-being. Sexual activity. Eating habits. Work and work Astronomer. Method of birth control. Menstrual cycle. Pregnancy history. Screening  You may have the following tests or measurements: Height, weight, and BMI. Blood pressure. Lipid and cholesterol levels. These may be checked every 5 years, or more frequently if you are over 71 years old. Skin check. Lung cancer screening. You may have this screening every year starting at age 70 if you have a 30-pack-year history of smoking and currently smoke or have quit within the past 15 years. Fecal occult blood test (FOBT) of the stool. You may have this test every year starting at age 54. Flexible sigmoidoscopy or colonoscopy. You may have a sigmoidoscopy every 5 years or a colonoscopy every 10 years starting at age 90. Hepatitis C blood test. Hepatitis B blood test. Sexually transmitted disease (STD) testing. Diabetes screening. This is done by checking your blood sugar (glucose) after you have not eaten for a while (fasting). You may have this done every 1-3 years. Mammogram. This may be done every 1-2 years. Talk to your health care provider about when you should start having regular mammograms. This may depend on whether you have a family history of breast cancer. BRCA-related cancer screening. This may be done if you have a family history of breast, ovarian, tubal, or peritoneal cancers. Pelvic exam and Pap test. This may be done every 3 years starting at age 31. Starting at age 46, this may be done every 5 years if you have a Pap test in combination with an HPV test. Bone density scan. This is done to screen for osteoporosis. You may have this scan if you are at high  risk for osteoporosis. Discuss your test results, treatment options, and if necessary, the need for more tests with your health care provider. Vaccines  Your health care provider may recommend certain vaccines, such as: Influenza vaccine. This is recommended every year. Tetanus, diphtheria, and acellular pertussis (Tdap, Td) vaccine. You may need a Td booster every 10 years. Zoster vaccine. You may need this after age 86. Pneumococcal 13-valent conjugate (PCV13) vaccine. You may need this if you have certain conditions and were not previously vaccinated. Pneumococcal polysaccharide (PPSV23) vaccine. You may need one or two doses if you smoke cigarettes or if you have certain conditions. Talk to your health care provider about which screenings and vaccines you need and how often you need them. This information is not intended to replace advice given to you by your health care provider. Make sure you discuss any questions you have with your health care provider. Document Released: 07/27/2015 Document Revised: 03/19/2016 Document Reviewed: 05/01/2015 Elsevier Interactive Patient Education  2017 [employer].    Fall Prevention in the [facility]  can cause injuries. They can happen to people of all ages. There are many things you can do to make your home safe and to help prevent falls. What can I do on the outside of my home? Regularly fix the edges of walkways and driveways and fix any cracks. Remove anything that might make you trip as you walk through a door, such as a raised step or threshold. Trim any bushes or trees on the path to your home. Use bright outdoor lighting. Clear any walking paths of anything that might make someone trip, such as rocks or tools. Regularly check to see if handrails are loose or broken. Make sure that both sides of any steps have handrails. Any raised decks and porches should have guardrails on the edges. Have any leaves, snow, or ice cleared regularly. Use  sand or salt on walking paths during winter. Clean up any spills in your garage right away. This includes oil or grease spills. What can I do in the bathroom? Use night lights. Install grab bars by the toilet and in the tub and shower. Do not use towel bars as grab bars. Use non-skid mats or decals in the tub or shower. If you need to sit down in the shower, use a plastic, non-slip stool. Keep the floor dry. Clean up any water that spills on the floor as soon as it happens. Remove soap buildup in the tub or shower regularly. Attach bath mats securely with double-sided non-slip rug tape. Do not have throw rugs and other things on the floor that can make you trip. What can I do in the bedroom? Use night lights. Make sure that you have a light by your bed that is easy to reach. Do not use any sheets or blankets that are too big for your bed. They should not hang down onto the floor. Have a firm chair that has side arms. You can use this for support while you get dressed. Do not have throw rugs and other things on the floor that can make you trip. What can I do in the kitchen? Clean up any spills right away. Avoid walking on wet floors. Keep items that you use a lot in easy-to-reach places. If you need to reach something above you, use a strong step stool that has a grab bar. Keep electrical cords out of the way. Do not use floor polish or wax that makes floors slippery. If you must use wax, use non-skid floor wax. Do not have throw rugs and other things on the floor that can make you trip. What can I do with my stairs? Do not leave any items on the stairs. Make sure that there are handrails on both sides of the stairs and use them. Fix handrails that are broken or loose. Make sure that handrails are as long as the stairways. Check any carpeting to make sure that it is firmly attached to the stairs. Fix any carpet that is loose or worn. Avoid having throw rugs at the top or bottom of the  stairs. If you do have throw rugs, attach them to the floor with carpet tape. Make sure that you have a light switch at the top of the stairs and the bottom of the stairs. If you do not have them, ask someone to add them for you. What else can I do to help prevent falls? Wear shoes that: Do not have high heels. Have rubber bottoms. Are comfortable and fit you well. Are closed at the  toe. Do not wear sandals. If you use a stepladder: Make sure that it is fully opened. Do not climb a closed stepladder. Make sure that both sides of the stepladder are locked into place. Ask someone to hold it for you, if possible. Clearly mark and make sure that you can see: Any grab bars or handrails. First and last steps. Where the edge of each step is. Use tools that help you move around (mobility aids) if they are needed. These include: Canes. Walkers. Scooters. Crutches. Turn on the lights when you go into a dark area. Replace any light bulbs as soon as they burn out. Set up your furniture so you have a clear path. Avoid moving your furniture around. If any of your floors are uneven, fix them. If there are any pets around you, be aware of where they are. Review your medicines with your doctor. Some medicines can make you feel dizzy. This can increase your chance of falling. Ask your doctor what other things that you can do to help prevent falls. This information is not intended to replace advice given to you by your health care provider. Make sure you discuss any questions you have with your health care provider. Document Released: 04/26/2009 Document Revised: 12/06/2015 Document Reviewed: 08/04/2014 Elsevier Interactive Patient Education  2017 [employer].

## 2023-01-11 ENCOUNTER — Other Ambulatory Visit: Payer: Self-pay | Admitting: Family

## 2023-01-11 DIAGNOSIS — K219 Gastro-esophageal reflux disease without esophagitis: Secondary | ICD-10-CM

## 2023-01-12 ENCOUNTER — Encounter: Payer: Self-pay | Admitting: Family

## 2023-04-08 ENCOUNTER — Other Ambulatory Visit: Payer: Self-pay | Admitting: Family

## 2023-04-08 DIAGNOSIS — E1169 Type 2 diabetes mellitus with other specified complication: Secondary | ICD-10-CM

## 2023-04-08 DIAGNOSIS — E1159 Type 2 diabetes mellitus with other circulatory complications: Secondary | ICD-10-CM

## 2023-04-09 ENCOUNTER — Ambulatory Visit: Payer: 59

## 2023-04-09 ENCOUNTER — Ambulatory Visit: Payer: 59 | Admitting: Family

## 2023-04-09 ENCOUNTER — Encounter: Payer: Self-pay | Admitting: Family

## 2023-04-09 VITALS — BP 139/72 | HR 79 | Temp 97.8°F | Ht <= 58 in | Wt 109.6 lb

## 2023-04-09 DIAGNOSIS — E1169 Type 2 diabetes mellitus with other specified complication: Secondary | ICD-10-CM | POA: Diagnosis not present

## 2023-04-09 DIAGNOSIS — E1159 Type 2 diabetes mellitus with other circulatory complications: Secondary | ICD-10-CM

## 2023-04-09 DIAGNOSIS — Z7984 Long term (current) use of oral hypoglycemic drugs: Secondary | ICD-10-CM

## 2023-04-09 DIAGNOSIS — F840 Autistic disorder: Secondary | ICD-10-CM

## 2023-04-09 DIAGNOSIS — Z780 Asymptomatic menopausal state: Secondary | ICD-10-CM

## 2023-04-09 DIAGNOSIS — E785 Hyperlipidemia, unspecified: Secondary | ICD-10-CM | POA: Diagnosis not present

## 2023-04-09 DIAGNOSIS — I152 Hypertension secondary to endocrine disorders: Secondary | ICD-10-CM | POA: Diagnosis not present

## 2023-04-09 DIAGNOSIS — K219 Gastro-esophageal reflux disease without esophagitis: Secondary | ICD-10-CM

## 2023-04-09 DIAGNOSIS — R6250 Unspecified lack of expected normal physiological development in childhood: Secondary | ICD-10-CM

## 2023-04-09 LAB — CMP14+EGFR
Calcium: 10.3 mg/dL (ref 8.7–10.3)
Chloride: 102 mmol/L (ref 96–106)
Glucose: 107 mg/dL — ABNORMAL HIGH (ref 70–99)
Potassium: 4.4 mmol/L (ref 3.5–5.2)
Sodium: 144 mmol/L (ref 134–144)

## 2023-04-09 LAB — BAYER DCA HB A1C WAIVED: HB A1C (BAYER DCA - WAIVED): 6 % — ABNORMAL HIGH (ref 4.8–5.6)

## 2023-04-09 MED ORDER — METFORMIN HCL 500 MG PO TABS
500.0000 mg | ORAL_TABLET | Freq: Every day | ORAL | 2 refills | Status: DC
Start: 2023-04-09 — End: 2023-10-09

## 2023-04-09 MED ORDER — OLMESARTAN MEDOXOMIL 5 MG PO TABS: ORAL_TABLET | ORAL | 1 refills | Status: DC

## 2023-04-09 MED ORDER — PRAVASTATIN SODIUM 40 MG PO TABS
ORAL_TABLET | ORAL | 2 refills | Status: DC
Start: 2023-04-09 — End: 2023-10-09

## 2023-04-09 MED ORDER — OMEPRAZOLE 20 MG PO CPDR
DELAYED_RELEASE_CAPSULE | ORAL | 0 refills | Status: DC
Start: 2023-04-09 — End: 2023-09-04

## 2023-04-09 MED ORDER — ONETOUCH DELICA PLUS LANCET33G MISC
3 refills | Status: DC
Start: 2023-04-09 — End: 2024-03-16

## 2023-04-09 MED ORDER — EMPAGLIFLOZIN 10 MG PO TABS
ORAL_TABLET | ORAL | 0 refills | Status: DC
Start: 2023-04-09 — End: 2023-09-04

## 2023-04-09 MED ORDER — ONETOUCH ULTRA VI STRP
ORAL_STRIP | 3 refills | Status: DC
Start: 2023-04-09 — End: 2024-03-16

## 2023-04-09 NOTE — Progress Notes (Signed)
Subjective:    Patient ID: Shelley Wood, female    DOB: Nov 27, 1955, 67 y.o.   MRN: [id number]  Chief Complaint  Patient presents with   Medical Management of Chronic Issues    Form for cpe    Pt presents to the office today for CPE and  chronic follow up. She has autism and development delay and  lives with her niece. She is doing a day program 5 days a day and every evening doing a 1:1 with field trip and activities.  Hypertension This is a chronic problem. The current episode started more than 1 year ago. The problem has been resolved since onset. The problem is controlled. Pertinent negatives include no blurred vision, malaise/fatigue, peripheral edema or shortness of breath. Risk factors for coronary artery disease include dyslipidemia, diabetes mellitus and sedentary lifestyle. The current treatment provides moderate improvement.  Diabetes She presents for her follow-up diabetic visit. She has type 2 diabetes mellitus. Pertinent negatives for diabetes include no blurred vision and no foot paresthesias. Risk factors for coronary artery disease include diabetes mellitus, dyslipidemia, hypertension, sedentary lifestyle and post-menopausal. She is following a generally healthy diet. Her overall blood glucose range is 110-130 mg/dl. Eye exam is not current.  Hyperlipidemia This is a chronic problem. The current episode started more than 1 year ago. The problem is controlled. Recent lipid tests were reviewed and are normal. Pertinent negatives include no shortness of breath. Current antihyperlipidemic treatment includes statins and exercise. The current treatment provides moderate improvement of lipids. Risk factors for coronary artery disease include dyslipidemia, hypertension, a sedentary lifestyle and post-menopausal.      Review of Systems  Constitutional:  Negative for malaise/fatigue.  Eyes:  Negative for blurred vision.  Respiratory:  Negative for shortness of breath.   All other  systems reviewed and are negative.      Objective:   Physical Exam Vitals reviewed.  Constitutional:      General: She is not in acute distress.    Appearance: She is well-developed.  HENT:     Head: Normocephalic and atraumatic.     Right Ear: Tympanic membrane normal.     Left Ear: Tympanic membrane normal.  Eyes:     Pupils: Pupils are equal, round, and reactive to light.  Neck:     Thyroid: No thyromegaly.  Cardiovascular:     Rate and Rhythm: Normal rate and regular rhythm.     Heart sounds: Normal heart sounds. No murmur heard. Pulmonary:     Effort: Pulmonary effort is normal. No respiratory distress.     Breath sounds: Normal breath sounds. No wheezing.  Abdominal:     General: Bowel sounds are normal. There is no distension.     Palpations: Abdomen is soft.     Tenderness: There is no abdominal tenderness.  Musculoskeletal:        General: No tenderness. Normal range of motion.     Cervical back: Normal range of motion and neck supple.  Skin:    General: Skin is warm and dry.  Neurological:     Mental Status: She is alert and oriented to person, place, and time.     Cranial Nerves: No cranial nerve deficit.     Deep Tendon Reflexes: Reflexes are normal and symmetric.  Psychiatric:        Behavior: Behavior normal.        Thought Content: Thought content normal.        Judgment: Judgment normal.  BP 139/72   Pulse 79   Temp 97.8 F (36.6 C) (Temporal)   Ht 4\' 7"  (1.397 m)   Wt 109 lb 9.6 oz (49.7 kg)   SpO2 100%   BMI 25.47 kg/m   Assessment & Plan:  Shelley Wood comes in today with chief complaint of Medical Management of Chronic Issues (Form for cpe )   Diagnosis and orders addressed:  1. Type 2 diabetes mellitus with other specified complication, without long-term current use of insulin (HCC) - empagliflozin (JARDIANCE) 10 MG TABS tablet; TAKE 1 TABLET (10 MG) BY MOUTH DAILY  Dispense: 90 tablet; Refill: 0 - glucose blood (ONETOUCH  ULTRA) test strip; Check BS up to 4 times daily Dx E11.9  Dispense: 400 strip; Refill: 3 - Lancets (ONETOUCH DELICA PLUS LANCET33G) MISC;  Test BS QID Dx E11.9  Dispense: 400 each; Refill: 3 - metFORMIN (GLUCOPHAGE) 500 MG tablet; Take 1 tablet (500 mg total) by mouth daily with breakfast. (Needs to be seen before next refill)  Dispense: 90 tablet; Refill: 2 - olmesartan (BENICAR) 5 MG tablet; TAKE 1 TABLET BY MOUTH EVERY DAY  Dispense: 90 tablet; Refill: 1 - Bayer DCA Hb A1c Waived - CMP14+EGFR - Microalbumin / creatinine urine ratio  2. Hypertension associated with diabetes (HCC)  - olmesartan (BENICAR) 5 MG tablet; TAKE 1 TABLET BY MOUTH EVERY DAY  Dispense: 90 tablet; Refill: 1 - CMP14+EGFR  3. Gastroesophageal reflux disease without esophagitis - omeprazole (PRILOSEC) 20 MG capsule; TAKE 1 CAPSULE(20 MG) BY MOUTH DAILY  Dispense: 90 capsule; Refill: 0 - CMP14+EGFR  4. Hyperlipidemia, unspecified hyperlipidemia type - pravastatin (PRAVACHOL) 40 MG tablet; TAKE 1 TABLET (40 MG) BY MOUTH DAILY  Dispense: 90 tablet; Refill: 2 - CMP14+EGFR  5. Autism - CMP14+EGFR  6. Development delay - CMP14+EGFR   Labs pending Forms completed for day program Health Maintenance reviewed Diet and exercise encouraged  Follow up plan: 6 months    Jannifer Rodney, FNP

## 2023-04-09 NOTE — Patient Instructions (Signed)
Health Maintenance After Age 65 After age 65, you are at a higher risk for certain long-term diseases and infections as well as injuries from falls. Falls are a major cause of broken bones and head injuries in people who are older than age 65. Getting regular preventive care can help to keep you healthy and well. Preventive care includes getting regular testing and making lifestyle changes as recommended by your health care provider. Talk with your health care provider about: Which screenings and tests you should have. A screening is a test that checks for a disease when you have no symptoms. A diet and exercise plan that is right for you. What should I know about screenings and tests to prevent falls? Screening and testing are the best ways to find a health problem early. Early diagnosis and treatment give you the best chance of managing medical conditions that are common after age 65. Certain conditions and lifestyle choices may make you more likely to have a fall. Your health care provider may recommend: Regular vision checks. Poor vision and conditions such as cataracts can make you more likely to have a fall. If you wear glasses, make sure to get your prescription updated if your vision changes. Medicine review. Work with your health care provider to regularly review all of the medicines you are taking, including over-the-counter medicines. Ask your health care provider about any side effects that may make you more likely to have a fall. Tell your health care provider if any medicines that you take make you feel dizzy or sleepy. Strength and balance checks. Your health care provider may recommend certain tests to check your strength and balance while standing, walking, or changing positions. Foot health exam. Foot pain and numbness, as well as not wearing proper footwear, can make you more likely to have a fall. Screenings, including: Osteoporosis screening. Osteoporosis is a condition that causes  the bones to get weaker and break more easily. Blood pressure screening. Blood pressure changes and medicines to control blood pressure can make you feel dizzy. Depression screening. You may be more likely to have a fall if you have a fear of falling, feel depressed, or feel unable to do activities that you used to do. Alcohol use screening. Using too much alcohol can affect your balance and may make you more likely to have a fall. Follow these instructions at home: Lifestyle Do not drink alcohol if: Your health care provider tells you not to drink. If you drink alcohol: Limit how much you have to: 0-1 drink a day for women. 0-2 drinks a day for men. Know how much alcohol is in your drink. In the [place] one drink equals one 12 oz bottle of beer (355 mL), one 5 oz glass of wine (148 mL), or one 1 oz glass of hard liquor (44 mL). Do not use any products that contain nicotine or tobacco. These products include cigarettes, chewing tobacco, and vaping devices, such as e-cigarettes. If you need help quitting, ask your health care provider. Activity  Follow a regular exercise program to stay fit. This will help you maintain your balance. Ask your health care provider what types of exercise are appropriate for you. If you need a cane or walker, use it as recommended by your health care provider. Wear supportive shoes that have nonskid soles. Safety  Remove any tripping hazards, such as rugs, cords, and clutter. Install safety equipment such as grab bars in bathrooms and safety rails on stairs. Keep rooms and walkways   well-lit. General instructions Talk with your health care provider about your risks for falling. Tell your health care provider if: You fall. Be sure to tell your health care provider about all falls, even ones that seem minor. You feel dizzy, tiredness (fatigue), or off-balance. Take over-the-counter and prescription medicines only as told by your health care provider. These include  supplements. Eat a healthy diet and maintain a healthy weight. A healthy diet includes low-fat dairy products, low-fat (lean) meats, and fiber from whole grains, beans, and lots of fruits and vegetables. Stay current with your vaccines. Schedule regular health, dental, and eye exams. Summary Having a healthy lifestyle and getting preventive care can help to protect your health and wellness after age 65. Screening and testing are the best way to find a health problem early and help you avoid having a fall. Early diagnosis and treatment give you the best chance for managing medical conditions that are more common for people who are older than age 65. Falls are a major cause of broken bones and head injuries in people who are older than age 65. Take precautions to prevent a fall at home. Work with your health care provider to learn what changes you can make to improve your health and wellness and to prevent falls. This information is not intended to replace advice given to you by your health care provider. Make sure you discuss any questions you have with your health care provider. Document Revised: 11/19/2020 Document Reviewed: 11/19/2020 Elsevier Patient Education  2024 [employer] Inc[employer]  

## 2023-04-10 LAB — MICROALBUMIN / CREATININE URINE RATIO
Creatinine, Urine: 16.5 mg/dL
Microalb/Creat Ratio: <18 mg/g{creat} (ref 0–29)
Microalbumin, Urine: <3 ug/mL

## 2023-05-21 ENCOUNTER — Encounter: Payer: Self-pay | Admitting: Family

## 2023-09-04 ENCOUNTER — Other Ambulatory Visit: Payer: Self-pay | Admitting: Family

## 2023-09-04 DIAGNOSIS — E1169 Type 2 diabetes mellitus with other specified complication: Secondary | ICD-10-CM

## 2023-09-04 DIAGNOSIS — K219 Gastro-esophageal reflux disease without esophagitis: Secondary | ICD-10-CM

## 2023-09-04 MED ORDER — EMPAGLIFLOZIN 10 MG PO TABS
10.0000 mg | ORAL_TABLET | Freq: Every day | ORAL | 0 refills | Status: DC
Start: 2023-09-04 — End: 2023-10-09

## 2023-09-04 NOTE — Addendum Note (Signed)
 Addended by: Julious Payer D on: 09/04/2023 04:01 PM   Modules accepted: Orders

## 2023-09-04 NOTE — Telephone Encounter (Signed)
 Refill failed. resent

## 2023-10-09 ENCOUNTER — Telehealth: Payer: Self-pay

## 2023-10-09 ENCOUNTER — Other Ambulatory Visit: Payer: Self-pay | Admitting: Family

## 2023-10-09 ENCOUNTER — Ambulatory Visit: Payer: 59 | Admitting: Family

## 2023-10-09 ENCOUNTER — Encounter: Payer: Self-pay | Admitting: Family

## 2023-10-09 VITALS — BP 119/79 | HR 181 | Temp 100.4°F | Ht <= 58 in | Wt 106.0 lb

## 2023-10-09 DIAGNOSIS — E119 Type 2 diabetes mellitus without complications: Secondary | ICD-10-CM | POA: Diagnosis not present

## 2023-10-09 DIAGNOSIS — R509 Fever, unspecified: Secondary | ICD-10-CM | POA: Diagnosis not present

## 2023-10-09 DIAGNOSIS — E1169 Type 2 diabetes mellitus with other specified complication: Secondary | ICD-10-CM | POA: Diagnosis not present

## 2023-10-09 DIAGNOSIS — I4891 Unspecified atrial fibrillation: Secondary | ICD-10-CM

## 2023-10-09 DIAGNOSIS — E785 Hyperlipidemia, unspecified: Secondary | ICD-10-CM

## 2023-10-09 DIAGNOSIS — Z0001 Encounter for general adult medical examination with abnormal findings: Secondary | ICD-10-CM | POA: Diagnosis not present

## 2023-10-09 DIAGNOSIS — R079 Chest pain, unspecified: Secondary | ICD-10-CM | POA: Diagnosis not present

## 2023-10-09 DIAGNOSIS — Z0000 Encounter for general adult medical examination without abnormal findings: Secondary | ICD-10-CM | POA: Diagnosis not present

## 2023-10-09 DIAGNOSIS — Z8709 Personal history of other diseases of the respiratory system: Secondary | ICD-10-CM | POA: Diagnosis not present

## 2023-10-09 DIAGNOSIS — J101 Influenza due to other identified influenza virus with other respiratory manifestations: Secondary | ICD-10-CM

## 2023-10-09 DIAGNOSIS — R9431 Abnormal electrocardiogram [ECG] [EKG]: Secondary | ICD-10-CM | POA: Diagnosis not present

## 2023-10-09 DIAGNOSIS — I10 Essential (primary) hypertension: Secondary | ICD-10-CM | POA: Diagnosis not present

## 2023-10-09 DIAGNOSIS — R000 Tachycardia, unspecified: Secondary | ICD-10-CM | POA: Diagnosis not present

## 2023-10-09 DIAGNOSIS — K219 Gastro-esophageal reflux disease without esophagitis: Secondary | ICD-10-CM

## 2023-10-09 DIAGNOSIS — R6250 Unspecified lack of expected normal physiological development in childhood: Secondary | ICD-10-CM | POA: Diagnosis not present

## 2023-10-09 DIAGNOSIS — F840 Autistic disorder: Secondary | ICD-10-CM

## 2023-10-09 DIAGNOSIS — E1159 Type 2 diabetes mellitus with other circulatory complications: Secondary | ICD-10-CM

## 2023-10-09 DIAGNOSIS — I152 Hypertension secondary to endocrine disorders: Secondary | ICD-10-CM | POA: Diagnosis not present

## 2023-10-09 LAB — BAYER DCA HB A1C WAIVED: HB A1C (BAYER DCA - WAIVED): 5.9 % — ABNORMAL HIGH (ref 4.8–5.6)

## 2023-10-09 LAB — VERITOR FLU A/B WAIVED
Influenza A: POSITIVE — AB
Influenza B: NEGATIVE

## 2023-10-09 MED ORDER — OMEPRAZOLE 20 MG PO CPDR: DELAYED_RELEASE_CAPSULE | ORAL | 0 refills | Status: DC

## 2023-10-09 MED ORDER — PRAVASTATIN SODIUM 40 MG PO TABS: ORAL_TABLET | ORAL | 2 refills | Status: DC

## 2023-10-09 MED ORDER — METOPROLOL TARTRATE 25 MG PO TABS: 12.5000 mg | ORAL_TABLET | Freq: Two times a day (BID) | ORAL | 2 refills | Status: DC

## 2023-10-09 MED ORDER — METFORMIN HCL 500 MG PO TABS: 500.0000 mg | ORAL_TABLET | Freq: Every day | ORAL | 2 refills | Status: DC

## 2023-10-09 MED ORDER — OLMESARTAN MEDOXOMIL 5 MG PO TABS: ORAL_TABLET | ORAL | 1 refills | Status: DC

## 2023-10-09 MED ORDER — EMPAGLIFLOZIN 10 MG PO TABS: 10.0000 mg | ORAL_TABLET | Freq: Every day | ORAL | 0 refills | Status: DC

## 2023-10-09 MED ORDER — METOPROLOL SUCCINATE ER 25 MG PO TB24: 25.0000 mg | ORAL_TABLET | Freq: Every day | ORAL | 3 refills | Status: DC

## 2023-10-09 NOTE — Progress Notes (Signed)
 Subjective:    Patient ID: Shelley Wood, female    DOB: 1956/03/06, 67 y.o.   MRN: [id number]  Chief Complaint  Patient presents with   Medical Management of Chronic Issues   URI    Few days. Wants something taking over the.   Fatigue   Pt presents to the office today for CPE and  chronic follow up.   She has autism and development delay and  lives with her niece. She is doing a day program 5 days a day and every evening doing a 1:1 with field trip and activities.   She reports URI last week with congestion, hoarse voice. Then started feeling better, but two days ago started coughing and fever.  Hypertension This is a chronic problem. The current episode started more than 1 year ago. The problem has been resolved since onset. The problem is controlled. Pertinent negatives include no blurred vision, malaise/fatigue, peripheral edema or shortness of breath. Risk factors for coronary artery disease include dyslipidemia, diabetes mellitus and sedentary lifestyle. The current treatment provides moderate improvement.  Diabetes She presents for her follow-up diabetic visit. She has type 2 diabetes mellitus. Pertinent negatives for diabetes include no blurred vision and no foot paresthesias. Risk factors for coronary artery disease include diabetes mellitus, dyslipidemia, hypertension, sedentary lifestyle and post-menopausal. She is following a generally healthy diet. Her overall blood glucose range is 90-110 mg/dl. Eye exam is not current.  Hyperlipidemia This is a chronic problem. The current episode started more than 1 year ago. The problem is controlled. Recent lipid tests were reviewed and are normal. Pertinent negatives include no shortness of breath. Current antihyperlipidemic treatment includes statins and exercise. The current treatment provides moderate improvement of lipids. Risk factors for coronary artery disease include dyslipidemia, hypertension, a sedentary lifestyle and  post-menopausal.  URI  This is a new problem. The current episode started 1 to 4 weeks ago. The problem has been gradually worsening. The maximum temperature recorded prior to her arrival was 100.4 - 100.9 F. Associated symptoms include congestion and coughing. She has tried antihistamine, decongestant and increased fluids for the symptoms.      Review of Systems  Constitutional:  Negative for malaise/fatigue.  HENT:  Positive for congestion.   Eyes:  Negative for blurred vision.  Respiratory:  Positive for cough. Negative for shortness of breath.   All other systems reviewed and are negative.  Family History  Problem Relation Age of Onset   Arthritis Mother    Heart disease Mother    Hypertension Mother    Stroke Mother    Alcohol abuse Father    Arthritis Father    Early death Father    Heart disease Father    Arthritis Sister    Birth defects Sister    Cancer Sister    COPD Sister    Hyperlipidemia Sister    Hypertension Sister    Arthritis Brother    Hyperlipidemia Brother    Hypertension Brother    Arthritis Maternal Grandmother    Arthritis Maternal Grandfather    Arthritis Brother    Arthritis Sister    Cancer Sister    Arthritis Sister    Learning disabilities Sister    Mental retardation Sister    Arthritis Sister    Arthritis Sister    Social History   Socioeconomic History   Marital status: Single    Spouse name: Not on file   Number of children: Not on file   Years of education: Not on  file   Highest education level: Not on file  Occupational History   Occupation: Disabled  Tobacco Use   Smoking status: Never   Smokeless tobacco: Never  Vaping Use   Vaping status: Never Used  Substance and Sexual Activity   Alcohol use: No   Drug use: No   Sexual activity: Never  Other Topics Concern   Not on file  Social History Narrative   She is mentally challenged; Lives with her sister, has a caretaker weekdays 8-5   Social Drivers of Health    Financial Resource Strain: Low Risk  (01/01/2023)   Overall Financial Resource Strain (CARDIA)    Difficulty of Paying Living Expenses: Not hard at all  Food Insecurity: No Food Insecurity (01/01/2023)   Hunger Vital Sign    Worried About Running Out of Food in the Last Year: Never true    Ran Out of Food in the Last Year: Never true  Transportation Needs: No Transportation Needs (01/01/2023)   PRAPARE - Administrator, Civil Service (Medical): No    Lack of Transportation (Non-Medical): No  Physical Activity: Insufficiently Active (01/01/2023)   Exercise Vital Sign    Days of Exercise per Week: 3 days    Minutes of Exercise per Session: 30 min  Stress: No Stress Concern Present (01/01/2023)   [employer] of Occupational Health - Occupational Stress Questionnaire    Feeling of Stress : Not at all  Social Connections: Moderately Integrated (01/01/2023)   Social Connection and Isolation Panel [NHANES]    Frequency of Communication with Friends and Family: More than three times a week    Frequency of Social Gatherings with Friends and Family: More than three times a week    Attends Religious Services: More than 4 times per year    Active Member of [employer] or Organizations: Yes    Attends Engineer, structural: More than 4 times per year    Marital Status: Never married        Objective:   Physical Exam Vitals reviewed.  Constitutional:      General: She is not in acute distress.    Appearance: She is well-developed.  HENT:     Head: Normocephalic and atraumatic.     Right Ear: Tympanic membrane normal.     Left Ear: Tympanic membrane normal.  Eyes:     Pupils: Pupils are equal, round, and reactive to light.  Neck:     Thyroid: No thyromegaly.  Cardiovascular:     Rate and Rhythm: Tachycardia present. Rhythm irregular.     Heart sounds: Normal heart sounds. No murmur heard. Pulmonary:     Effort: Pulmonary effort is normal. No respiratory distress.      Breath sounds: Normal breath sounds. No wheezing.  Abdominal:     General: Bowel sounds are normal. There is no distension.     Palpations: Abdomen is soft.     Tenderness: There is no abdominal tenderness.  Musculoskeletal:        General: No tenderness. Normal range of motion.     Cervical back: Normal range of motion and neck supple.  Skin:    General: Skin is warm and dry.  Neurological:     Mental Status: She is alert and oriented to person, place, and time.     Cranial Nerves: No cranial nerve deficit.     Deep Tendon Reflexes: Reflexes are normal and symmetric.  Psychiatric:        Behavior: Behavior normal.  Thought Content: Thought content normal.        Judgment: Judgment normal.         BP 119/79   Pulse (!) 181   Temp (!) 100.4 F (38 C) (Temporal)   Ht 4\' 7"  (1.397 m)   Wt 106 lb (48.1 kg)   SpO2 95%   BMI 24.64 kg/m   Assessment & Plan:  Shelley Wood comes in today with chief complaint of Medical Management of Chronic Issues, URI (Few days. Wants something taking over the.), and Fatigue   Diagnosis and orders addressed:  1. Type 2 diabetes mellitus with other specified complication, without long-term current use of insulin (HCC) - Bayer DCA Hb A1c Waived - CBC with Differential/Platelet - CMP14+EGFR - empagliflozin (JARDIANCE) 10 MG TABS tablet; Take 1 tablet (10 mg total) by mouth daily.  Dispense: 90 tablet; Refill: 0 - metFORMIN (GLUCOPHAGE) 500 MG tablet; Take 1 tablet (500 mg total) by mouth daily with breakfast. (Needs to be seen before next refill)  Dispense: 90 tablet; Refill: 2 - olmesartan (BENICAR) 5 MG tablet; TAKE 1 TABLET BY MOUTH EVERY DAY  Dispense: 90 tablet; Refill: 1  2. Hypertension associated with diabetes (HCC) - CBC with Differential/Platelet - CMP14+EGFR - olmesartan (BENICAR) 5 MG tablet; TAKE 1 TABLET BY MOUTH EVERY DAY  Dispense: 90 tablet; Refill: 1 - metoprolol succinate (TOPROL-XL) 25 MG 24 hr tablet; Take 1 tablet  (25 mg total) by mouth daily.  Dispense: 90 tablet; Refill: 3  3. Gastroesophageal reflux disease without esophagitis  - CBC with Differential/Platelet - CMP14+EGFR - omeprazole (PRILOSEC) 20 MG capsule; TAKE 1 CAPSULE(20 MG) BY MOUTH DAILY  Dispense: 90 capsule; Refill: 0  4. Hyperlipidemia, unspecified hyperlipidemia type - CBC with Differential/Platelet - CMP14+EGFR - Lipid panel - pravastatin (PRAVACHOL) 40 MG tablet; TAKE 1 TABLET (40 MG) BY MOUTH DAILY  Dispense: 90 tablet; Refill: 2  5. Fever, unspecified fever cause - CBC with Differential/Platelet - CMP14+EGFR - Veritor Flu A/B Waived  6. Heart rate fast - CBC with Differential/Platelet - CMP14+EGFR - EKG 12-Lead  7. Annual physical exam (Primary) - Bayer DCA Hb A1c Waived - CBC with Differential/Platelet - CMP14+EGFR - Lipid panel - TSH  8. Autism - CBC with Differential/Platelet - CMP14+EGFR  9. Development delay - CBC with Differential/Platelet - CMP14+EGFR  10. Influenza A  - CBC with Differential/Platelet - CMP14+EGFR  11. New onset atrial fibrillation (HCC) - CBC with Differential/Platelet - CMP14+EGFR - TSH - metoprolol succinate (TOPROL-XL) 25 MG 24 hr tablet; Take 1 tablet (25 mg total) by mouth daily.  Dispense: 90 tablet; Refill: 3 - Ambulatory referral to Cardiology  12. Abnormal EKG   Pt sent to ED given EKG of new ST depression and new A Fib Start metoprolol 25 mg today Referral to Cardiologists pending  Flu A- Rest, force fluids, motrin. Follow up if symptoms worsen or do not improve  Labs pending Health Maintenance reviewed Diet and exercise encouraged  Follow up plan: Hospital follow up   Jannifer Rodney, FNP

## 2023-10-09 NOTE — Patient Instructions (Signed)
 Atrial Fibrillation Atrial fibrillation (AFib) is a type of irregular or rapid heartbeat (arrhythmia). In AFib, the top part of the heart (atria) beats in an irregular pattern. This makes the heart unable to pump blood normally and effectively. The goal of treatment is to prevent blood clots from forming, control your heart rate, or restore your heartbeat to a normal rhythm. If this condition is not treated, it can cause serious problems, such as a weakened heart muscle (cardiomyopathy) or a stroke. What are the causes? This condition is often caused by medical conditions that damage the heart's electrical system. These include: High blood pressure (hypertension). This is the most common cause. Certain heart problems or conditions, such as heart failure, coronary artery disease, heart valve problems, or heart surgery. Diabetes. Overactive thyroid (hyperthyroidism). Chronic kidney disease. Certain lung conditions, such as emphysema, pneumonia, or COPD. Obstructive sleep apnea. In some cases, the cause of this condition is not known. What increases the risk? This condition is more likely to develop in: Older adults. Athletes who do endurance exercise. People who have a family history of AFib. Males. People who are Caucasian. People who are obese. People who smoke or misuse alcohol. What are the signs or symptoms? Symptoms of this condition include: Fast or irregular heartbeats (palpitations). Discomfort or pain in your chest. Shortness of breath. Sudden light-headedness or weakness. Tiring easily during exercise or activity. Syncope (fainting). Sweating. In some cases, there are no symptoms. How is this diagnosed? Your health care provider may detect AFib when taking your pulse. If detected, this condition may be diagnosed with: An electrocardiogram (ECG) to check electrical signals of the heart. An ambulatory cardiac monitor to record your heart's activity for a few days. A  transthoracic echocardiogram (TTE) to create pictures of your heart. A transesophageal echocardiogram (TEE) to create even clearer pictures of your heart. A stress test to check your blood supply while you exercise. Imaging tests, such as a CT scan or chest X-ray. Blood tests. How is this treated? Treatment depends on underlying conditions and how you feel when you get AFib. This condition may be treated with: Medicines to prevent blood clots or to treat heart rate or heart rhythm problems. Electrical cardioversion to reset the heart's rhythm. A pacemaker to correct abnormal heart rhythm. Ablation to remove the heart tissue that sends abnormal signals. Left atrial appendage closure to seal the area where blood clots can form. In some cases, underlying conditions will be treated. Follow these instructions at home: Medicines Take over-the counter and prescription medicines only as told by your provider. Do not take any new medicines without talking to your provider. If you are taking blood thinners: Talk with your provider before taking aspirin or NSAIDs. These medicines can raise your risk of bleeding. Take your medicines as told. Take them at the same time each day. Do not do things that could hurt or bruise you. Be careful to avoid falls. Wear an alert bracelet or carry a card that says that you take blood thinners. Lifestyle Do not use any products that contain nicotine or tobacco. These products include cigarettes, chewing tobacco, and vaping devices, such as e-cigarettes. If you need help quitting, ask your provider. Eat heart-healthy foods. Talk with a food expert (dietitian) to make an eating plan that is right for you. Exercise regularly as told by your provider. Do not drink alcohol. Lose weight if you are overweight. General instructions If you have obstructive sleep apnea, manage your condition as told by your provider.  Do not use diet pills unless your provider approves. Diet  pills can make heart problems worse. Keep all follow-up visits. Your provider will want to check your heart rate and rhythm regularly. Contact a health care provider if: You notice a change in the rate, rhythm, or strength of your heartbeat. You are taking a blood thinner and you notice more bruising. You tire more easily when you exercise or do heavy work. You have a sudden change in weight. Get help right away if:  You have chest pain. You have trouble breathing. You have side effects of blood thinners, such as blood in your vomit, poop (stool), or pee (urine), or bleeding that does not stop. You have any symptoms of a stroke. "BE FAST" is an easy way to remember the main warning signs of a stroke: B - Balance. Signs are dizziness, sudden trouble walking, or loss of balance. E - Eyes. Signs are trouble seeing or a sudden change in vision. F - Face. Signs are sudden weakness or numbness of the face, or the face or eyelid drooping on one side. A - Arms. Signs are weakness or numbness in an arm. This happens suddenly and usually on one side of the body. S - Speech.Signs are sudden trouble speaking, slurred speech, or trouble understanding what people say. T - Time. Time to call emergency services. Write down what time symptoms started. Other signs of a stroke, such as: A sudden, severe headache with no known cause. Nausea or vomiting. Seizure. These symptoms may be an emergency. Get help right away. Call 911. Do not wait to see if the symptoms will go away. Do not drive yourself to the hospital. This information is not intended to replace advice given to you by your health care provider. Make sure you discuss any questions you have with your health care provider. Document Revised: 03/19/2022 Document Reviewed: 03/19/2022 Elsevier Patient Education  2024 [employer].

## 2023-10-09 NOTE — Telephone Encounter (Signed)
 Please review and advise.

## 2023-10-09 NOTE — Telephone Encounter (Signed)
 Patient's niece Enrique Sack, on [place], called, no answer, recording call could not be completed as dialed, consult your directory.  Copied from CRM [id number]. Topic: Clinical - Medical Advice >> Oct 09, 2023  3:01 PM Antwanette L wrote: Reason for CRM: Patient care taker Enrique Sack) is calling to leave Jannifer Rodney a message. Patient was advised to go the emergency room after appointment.  The ER did an EKG and the patient heart rate was elevated and was give iv fluid. Patient heart rate and rhythm is normal. Chest x-ray and kidney function is normal. Jannifer Rodney prescribed some medicine today, and Enrique Sack wants to know if the patient should take the medicine? Enrique Sack does not know the name of the medicine. Please contact Kendra at [phone].

## 2023-10-09 NOTE — Telephone Encounter (Signed)
 Called and left message on caregivers phone. Will decrease metoprolol to 12.5 mg BID from 25 mg. Keep Cardiologists appointment and follow up with me.

## 2023-10-10 LAB — CBC WITH DIFFERENTIAL/PLATELET
Basophils Absolute: 0 10*3/uL (ref 0.0–0.2)
Basos: 0 %
EOS (ABSOLUTE): 0 10*3/uL (ref 0.0–0.4)
Eos: 0 %
Hematocrit: 41.8 % (ref 34.0–46.6)
Hemoglobin: 13.4 g/dL (ref 11.1–15.9)
Immature Grans (Abs): 0 10*3/uL (ref 0.0–0.1)
Immature Granulocytes: 0 %
Lymphocytes Absolute: 0.5 10*3/uL — ABNORMAL LOW (ref 0.7–3.1)
Lymphs: 7 %
MCH: 28.8 pg (ref 26.6–33.0)
MCHC: 32.1 g/dL (ref 31.5–35.7)
MCV: 90 fL (ref 79–97)
Monocytes Absolute: 1.1 10*3/uL — ABNORMAL HIGH (ref 0.1–0.9)
Monocytes: 15 %
Neutrophils Absolute: 5.6 10*3/uL (ref 1.4–7.0)
Neutrophils: 78 %
Platelets: 383 10*3/uL (ref 150–450)
RBC: 4.66 x10E6/uL (ref 3.77–5.28)
RDW: 13.8 % (ref 11.7–15.4)
WBC: 7.3 10*3/uL (ref 3.4–10.8)

## 2023-10-10 LAB — CMP14+EGFR
ALT: 36 IU/L — ABNORMAL HIGH (ref 0–32)
AST: 23 IU/L (ref 0–40)
Albumin: 4.5 g/dL (ref 3.9–4.9)
Alkaline Phosphatase: 69 IU/L (ref 44–121)
BUN/Creatinine Ratio: 18 (ref 12–28)
BUN: 15 mg/dL (ref 8–27)
Bilirubin Total: <0.2 mg/dL (ref 0.0–1.2)
CO2: 22 mmol/L (ref 20–29)
Calcium: 9.8 mg/dL (ref 8.7–10.3)
Chloride: 101 mmol/L (ref 96–106)
Creatinine, Ser: 0.85 mg/dL (ref 0.57–1.00)
Globulin, Total: 2.6 g/dL (ref 1.5–4.5)
Glucose: 131 mg/dL — ABNORMAL HIGH (ref 70–99)
Potassium: 4.4 mmol/L (ref 3.5–5.2)
Sodium: 141 mmol/L (ref 134–144)
Total Protein: 7.1 g/dL (ref 6.0–8.5)
eGFR: 76 mL/min/{1.73_m2} (ref >59)

## 2023-10-10 LAB — LIPID PANEL
Chol/HDL Ratio: 3 ratio (ref 0.0–4.4)
Cholesterol, Total: 120 mg/dL (ref 100–199)
HDL: 40 mg/dL (ref >39)
LDL Chol Calc (NIH): 67 mg/dL (ref 0–99)
Triglycerides: 60 mg/dL (ref 0–149)
VLDL Cholesterol Cal: 13 mg/dL (ref 5–40)

## 2023-10-10 LAB — TSH: TSH: 0.408 u[IU]/mL — ABNORMAL LOW (ref 0.450–4.500)

## 2023-10-29 ENCOUNTER — Telehealth: Payer: Self-pay | Admitting: Family

## 2023-11-04 DIAGNOSIS — E1169 Type 2 diabetes mellitus with other specified complication: Secondary | ICD-10-CM | POA: Diagnosis not present

## 2023-11-04 DIAGNOSIS — E785 Hyperlipidemia, unspecified: Secondary | ICD-10-CM | POA: Diagnosis not present

## 2023-11-04 DIAGNOSIS — I10 Essential (primary) hypertension: Secondary | ICD-10-CM | POA: Diagnosis not present

## 2023-11-04 DIAGNOSIS — R9431 Abnormal electrocardiogram [ECG] [EKG]: Secondary | ICD-10-CM | POA: Diagnosis not present

## 2023-11-04 DIAGNOSIS — K219 Gastro-esophageal reflux disease without esophagitis: Secondary | ICD-10-CM | POA: Diagnosis not present

## 2023-11-04 DIAGNOSIS — I471 Supraventricular tachycardia, unspecified: Secondary | ICD-10-CM | POA: Diagnosis not present

## 2023-11-05 ENCOUNTER — Other Ambulatory Visit: Payer: Self-pay

## 2023-11-05 ENCOUNTER — Encounter: Payer: Self-pay | Admitting: Family

## 2023-11-05 ENCOUNTER — Ambulatory Visit: Admitting: Family

## 2023-11-05 VITALS — BP 139/68 | HR 73 | Temp 97.0°F | Ht <= 58 in | Wt 107.2 lb

## 2023-11-05 DIAGNOSIS — E1159 Type 2 diabetes mellitus with other circulatory complications: Secondary | ICD-10-CM | POA: Diagnosis not present

## 2023-11-05 DIAGNOSIS — F840 Autistic disorder: Secondary | ICD-10-CM

## 2023-11-05 DIAGNOSIS — E1169 Type 2 diabetes mellitus with other specified complication: Secondary | ICD-10-CM | POA: Diagnosis not present

## 2023-11-05 DIAGNOSIS — I152 Hypertension secondary to endocrine disorders: Secondary | ICD-10-CM

## 2023-11-05 DIAGNOSIS — E785 Hyperlipidemia, unspecified: Secondary | ICD-10-CM

## 2023-11-05 DIAGNOSIS — R6250 Unspecified lack of expected normal physiological development in childhood: Secondary | ICD-10-CM | POA: Diagnosis not present

## 2023-11-05 DIAGNOSIS — I471 Supraventricular tachycardia, unspecified: Secondary | ICD-10-CM | POA: Diagnosis not present

## 2023-11-05 DIAGNOSIS — R7989 Other specified abnormal findings of blood chemistry: Secondary | ICD-10-CM

## 2023-11-05 NOTE — Progress Notes (Signed)
 Subjective:    Patient ID: Shelley Wood, female    DOB: [birth year], 68 y.o.   MRN: [id number]  Chief Complaint  Patient presents with   Follow-up   Pt presents to the office today for chronic follow up. She has autism and development delay and  lives with her niece. She is doing a day program 5 days a day and every evening doing a 1:1 with field trip and activities.    She saw Cardiologists yesterday for atrial flutter/fib. She was seen on 10/09/23 and diagnosed with flu and had an EKG with A Fib.  Hypertension This is a chronic problem. The current episode started more than 1 year ago. The problem has been resolved since onset. The problem is controlled. Pertinent negatives include no blurred vision, malaise/fatigue, peripheral edema or shortness of breath. Risk factors for coronary artery disease include dyslipidemia, diabetes mellitus and sedentary lifestyle. The current treatment provides moderate improvement.  Diabetes She presents for her follow-up diabetic visit. She has type 2 diabetes mellitus. Pertinent negatives for diabetes include no blurred vision and no foot paresthesias. Risk factors for coronary artery disease include diabetes mellitus, dyslipidemia, hypertension, sedentary lifestyle and post-menopausal. She is following a generally healthy diet. Her overall blood glucose range is 110-130 mg/dl. Eye exam is not current.  Hyperlipidemia This is a chronic problem. The current episode started more than 1 year ago. The problem is controlled. Recent lipid tests were reviewed and are normal. Pertinent negatives include no shortness of breath. Current antihyperlipidemic treatment includes statins and exercise. The current treatment provides moderate improvement of lipids. Risk factors for coronary artery disease include dyslipidemia, hypertension, a sedentary lifestyle and post-menopausal.      Review of Systems  Constitutional:  Negative for malaise/fatigue.  Eyes:  Negative for  blurred vision.  Respiratory:  Negative for shortness of breath.   All other systems reviewed and are negative.      Objective:   Physical Exam Vitals reviewed.  Constitutional:      General: She is not in acute distress.    Appearance: She is well-developed.  HENT:     Head: Normocephalic and atraumatic.     Right Ear: Tympanic membrane normal.     Left Ear: Tympanic membrane normal.  Eyes:     Pupils: Pupils are equal, round, and reactive to light.  Neck:     Thyroid : No thyromegaly.  Cardiovascular:     Rate and Rhythm: Normal rate and regular rhythm.     Heart sounds: Normal heart sounds. No murmur heard. Pulmonary:     Effort: Pulmonary effort is normal. No respiratory distress.     Breath sounds: Normal breath sounds. No wheezing.  Abdominal:     General: Bowel sounds are normal. There is no distension.     Palpations: Abdomen is soft.     Tenderness: There is no abdominal tenderness.  Musculoskeletal:        General: No tenderness. Normal range of motion.     Cervical back: Normal range of motion and neck supple.  Skin:    General: Skin is warm and dry.  Neurological:     Mental Status: She is alert and oriented to person, place, and time.     Cranial Nerves: No cranial nerve deficit.     Deep Tendon Reflexes: Reflexes are normal and symmetric.  Psychiatric:        Behavior: Behavior normal.        Thought Content: Thought content normal.  BP 139/68   Pulse 73   Temp (!) 97 F (36.1 C) (Temporal)   Ht 4\' 7"  (1.397 m)   Wt 107 lb 3.2 oz (48.6 kg)   SpO2 99%   BMI 24.92 kg/m   Assessment & Plan:  Shelley Wood comes in today with chief complaint of Follow-up   Diagnosis and orders addressed:  1. Type 2 diabetes mellitus with other specified complication, without long-term current use of insulin (HCC) (Primary) - CMP14+EGFR  2. Autism - CMP14+EGFR  3. Development delay - CMP14+EGFR  4. Hyperlipidemia, unspecified hyperlipidemia  type - CMP14+EGFR  5. Hypertension associated with diabetes (HCC) - CMP14+EGFR  6. Abnormal TSH - CMP14+EGFR - Thyroid  Panel With TSH   Labs pending Keep specialists follow up, will follow up with Cardiologists for monitor  Health Maintenance reviewed Diet and exercise encouraged  Follow up plan: 6 months    Tommas Fragmin, FNP

## 2023-11-05 NOTE — Patient Instructions (Signed)
 Health Maintenance After Age 66 After age 4, you are at a higher risk for certain long-term diseases and infections as well as injuries from falls. Falls are a major cause of broken bones and head injuries in people who are older than age 47. Getting regular preventive care can help to keep you healthy and well. Preventive care includes getting regular testing and making lifestyle changes as recommended by your health care provider. Talk with your health care provider about: Which screenings and tests you should have. A screening is a test that checks for a disease when you have no symptoms. A diet and exercise plan that is right for you. What should I know about screenings and tests to prevent falls? Screening and testing are the best ways to find a health problem early. Early diagnosis and treatment give you the best chance of managing medical conditions that are common after age 37. Certain conditions and lifestyle choices may make you more likely to have a fall. Your health care provider may recommend: Regular vision checks. Poor vision and conditions such as cataracts can make you more likely to have a fall. If you wear glasses, make sure to get your prescription updated if your vision changes. Medicine review. Work with your health care provider to regularly review all of the medicines you are taking, including over-the-counter medicines. Ask your health care provider about any side effects that may make you more likely to have a fall. Tell your health care provider if any medicines that you take make you feel dizzy or sleepy. Strength and balance checks. Your health care provider may recommend certain tests to check your strength and balance while standing, walking, or changing positions. Foot health exam. Foot pain and numbness, as well as not wearing proper footwear, can make you more likely to have a fall. Screenings, including: Osteoporosis screening. Osteoporosis is a condition that causes  the bones to get weaker and break more easily. Blood pressure screening. Blood pressure changes and medicines to control blood pressure can make you feel dizzy. Depression screening. You may be more likely to have a fall if you have a fear of falling, feel depressed, or feel unable to do activities that you used to do. Alcohol use screening. Using too much alcohol can affect your balance and may make you more likely to have a fall. Follow these instructions at home: Lifestyle Do not drink alcohol if: Your health care provider tells you not to drink. If you drink alcohol: Limit how much you have to: 0-1 drink a day for women. 0-2 drinks a day for men. Know how much alcohol is in your drink. In the [place] one drink equals one 12 oz bottle of beer (355 mL), one 5 oz glass of wine (148 mL), or one 1 oz glass of hard liquor (44 mL). Do not use any products that contain nicotine or tobacco. These products include cigarettes, chewing tobacco, and vaping devices, such as e-cigarettes. If you need help quitting, ask your health care provider. Activity  Follow a regular exercise program to stay fit. This will help you maintain your balance. Ask your health care provider what types of exercise are appropriate for you. If you need a cane or walker, use it as recommended by your health care provider. Wear supportive shoes that have nonskid soles. Safety  Remove any tripping hazards, such as rugs, cords, and clutter. Install safety equipment such as grab bars in bathrooms and safety rails on stairs. Keep rooms and walkways  well-lit. General instructions Talk with your health care provider about your risks for falling. Tell your health care provider if: You fall. Be sure to tell your health care provider about all falls, even ones that seem minor. You feel dizzy, tiredness (fatigue), or off-balance. Take over-the-counter and prescription medicines only as told by your health care provider. These include  supplements. Eat a healthy diet and maintain a healthy weight. A healthy diet includes low-fat dairy products, low-fat (lean) meats, and fiber from whole grains, beans, and lots of fruits and vegetables. Stay current with your vaccines. Schedule regular health, dental, and eye exams. Summary Having a healthy lifestyle and getting preventive care can help to protect your health and wellness after age 11. Screening and testing are the best way to find a health problem early and help you avoid having a fall. Early diagnosis and treatment give you the best chance for managing medical conditions that are more common for people who are older than age 28. Falls are a major cause of broken bones and head injuries in people who are older than age 48. Take precautions to prevent a fall at home. Work with your health care provider to learn what changes you can make to improve your health and wellness and to prevent falls. This information is not intended to replace advice given to you by your health care provider. Make sure you discuss any questions you have with your health care provider. Document Revised: 11/19/2020 Document Reviewed: 11/19/2020 Elsevier Patient Education  2024 [employer].

## 2023-11-25 ENCOUNTER — Encounter: Payer: Self-pay | Admitting: Family

## 2023-12-02 DIAGNOSIS — I471 Supraventricular tachycardia, unspecified: Secondary | ICD-10-CM | POA: Diagnosis not present

## 2023-12-02 DIAGNOSIS — I4891 Unspecified atrial fibrillation: Secondary | ICD-10-CM | POA: Diagnosis not present

## 2023-12-21 ENCOUNTER — Telehealth: Payer: Self-pay | Admitting: Family

## 2023-12-25 DIAGNOSIS — R9431 Abnormal electrocardiogram [ECG] [EKG]: Secondary | ICD-10-CM | POA: Diagnosis not present

## 2023-12-25 DIAGNOSIS — I471 Supraventricular tachycardia, unspecified: Secondary | ICD-10-CM | POA: Diagnosis not present

## 2023-12-30 ENCOUNTER — Encounter: Payer: Self-pay | Admitting: Family

## 2024-02-25 DIAGNOSIS — I10 Essential (primary) hypertension: Secondary | ICD-10-CM | POA: Diagnosis not present

## 2024-02-25 DIAGNOSIS — I480 Paroxysmal atrial fibrillation: Secondary | ICD-10-CM | POA: Insufficient documentation

## 2024-02-29 ENCOUNTER — Other Ambulatory Visit: Payer: Self-pay | Admitting: Family

## 2024-02-29 DIAGNOSIS — K219 Gastro-esophageal reflux disease without esophagitis: Secondary | ICD-10-CM

## 2024-02-29 NOTE — Telephone Encounter (Signed)
 Christy NTBS in Oct for 6 mos FU RF sent to pharmacy

## 2024-03-01 ENCOUNTER — Encounter: Payer: Self-pay | Admitting: Family

## 2024-03-01 NOTE — Telephone Encounter (Signed)
LMOVM to call and make appt. Letter sent. 

## 2024-03-08 ENCOUNTER — Other Ambulatory Visit: Payer: Self-pay | Admitting: Family

## 2024-03-16 ENCOUNTER — Other Ambulatory Visit: Payer: Self-pay | Admitting: Family

## 2024-03-16 DIAGNOSIS — E1169 Type 2 diabetes mellitus with other specified complication: Secondary | ICD-10-CM

## 2024-03-16 MED ORDER — ACCU-CHEK SOFTCLIX LANCETS MISC: 3 refills | Status: AC

## 2024-03-16 MED ORDER — ACCU-CHEK GUIDE TEST VI STRP: ORAL_STRIP | 3 refills | Status: AC

## 2024-04-14 ENCOUNTER — Other Ambulatory Visit: Payer: Self-pay | Admitting: Family

## 2024-04-14 ENCOUNTER — Ambulatory Visit (INDEPENDENT_AMBULATORY_CARE_PROVIDER_SITE_OTHER)

## 2024-04-14 ENCOUNTER — Ambulatory Visit (INDEPENDENT_AMBULATORY_CARE_PROVIDER_SITE_OTHER): Admitting: Family

## 2024-04-14 ENCOUNTER — Encounter: Payer: Self-pay | Admitting: Family

## 2024-04-14 VITALS — BP 135/67 | HR 71 | Temp 97.8°F | Wt 106.4 lb

## 2024-04-14 DIAGNOSIS — F840 Autistic disorder: Secondary | ICD-10-CM

## 2024-04-14 DIAGNOSIS — E1159 Type 2 diabetes mellitus with other circulatory complications: Secondary | ICD-10-CM

## 2024-04-14 DIAGNOSIS — I480 Paroxysmal atrial fibrillation: Secondary | ICD-10-CM

## 2024-04-14 DIAGNOSIS — Z780 Asymptomatic menopausal state: Secondary | ICD-10-CM

## 2024-04-14 DIAGNOSIS — R6250 Unspecified lack of expected normal physiological development in childhood: Secondary | ICD-10-CM | POA: Diagnosis not present

## 2024-04-14 DIAGNOSIS — E785 Hyperlipidemia, unspecified: Secondary | ICD-10-CM | POA: Diagnosis not present

## 2024-04-14 DIAGNOSIS — E1169 Type 2 diabetes mellitus with other specified complication: Secondary | ICD-10-CM | POA: Diagnosis not present

## 2024-04-14 DIAGNOSIS — Z7984 Long term (current) use of oral hypoglycemic drugs: Secondary | ICD-10-CM | POA: Diagnosis not present

## 2024-04-14 DIAGNOSIS — K219 Gastro-esophageal reflux disease without esophagitis: Secondary | ICD-10-CM

## 2024-04-14 DIAGNOSIS — I152 Hypertension secondary to endocrine disorders: Secondary | ICD-10-CM | POA: Diagnosis not present

## 2024-04-14 LAB — CMP14+EGFR
ALT: 14 IU/L (ref 0–32)
AST: 14 IU/L (ref 0–40)
Albumin: 4.7 g/dL (ref 3.9–4.9)
Alkaline Phosphatase: 60 IU/L (ref 49–135)
BUN/Creatinine Ratio: 16 (ref 12–28)
BUN: 14 mg/dL (ref 8–27)
Bilirubin Total: 0.4 mg/dL (ref 0.0–1.2)
CO2: 23 mmol/L (ref 20–29)
Calcium: 10.2 mg/dL (ref 8.7–10.3)
Chloride: 103 mmol/L (ref 96–106)
Creatinine, Ser: 0.86 mg/dL (ref 0.57–1.00)
Globulin, Total: 2.8 g/dL (ref 1.5–4.5)
Glucose: 128 mg/dL — ABNORMAL HIGH (ref 70–99)
Potassium: 4.6 mmol/L (ref 3.5–5.2)
Sodium: 143 mmol/L (ref 134–144)
Total Protein: 7.5 g/dL (ref 6.0–8.5)
eGFR: 74 mL/min/1.73 (ref >59)

## 2024-04-14 LAB — CBC WITH DIFFERENTIAL/PLATELET
Basophils Absolute: 0 x10E3/uL (ref 0.0–0.2)
Basos: 1 %
EOS (ABSOLUTE): 0.1 x10E3/uL (ref 0.0–0.4)
Eos: 2 %
Hematocrit: 43.6 % (ref 34.0–46.6)
Hemoglobin: 13.8 g/dL (ref 11.1–15.9)
Immature Grans (Abs): 0 x10E3/uL (ref 0.0–0.1)
Immature Granulocytes: 0 %
Lymphocytes Absolute: 1.7 x10E3/uL (ref 0.7–3.1)
Lymphs: 31 %
MCH: 29.7 pg (ref 26.6–33.0)
MCHC: 31.7 g/dL (ref 31.5–35.7)
MCV: 94 fL (ref 79–97)
Monocytes Absolute: 0.4 x10E3/uL (ref 0.1–0.9)
Monocytes: 7 %
Neutrophils Absolute: 3.2 x10E3/uL (ref 1.4–7.0)
Neutrophils: 59 %
Platelets: 339 x10E3/uL (ref 150–450)
RBC: 4.65 x10E6/uL (ref 3.77–5.28)
RDW: 13.6 % (ref 11.7–15.4)
WBC: 5.4 x10E3/uL (ref 3.4–10.8)

## 2024-04-14 LAB — BAYER DCA HB A1C WAIVED: HB A1C (BAYER DCA - WAIVED): 6.2 % — ABNORMAL HIGH (ref 4.8–5.6)

## 2024-04-14 MED ORDER — EMPAGLIFLOZIN 10 MG PO TABS: 10.0000 mg | ORAL_TABLET | Freq: Every day | ORAL | 0 refills | Status: AC

## 2024-04-14 MED ORDER — CETIRIZINE HCL 10 MG PO TABS: 10.0000 mg | ORAL_TABLET | Freq: Every day | ORAL | 1 refills | Status: AC

## 2024-04-14 MED ORDER — OMEPRAZOLE 20 MG PO CPDR: DELAYED_RELEASE_CAPSULE | ORAL | 0 refills | Status: AC

## 2024-04-14 MED ORDER — PRAVASTATIN SODIUM 40 MG PO TABS: ORAL_TABLET | ORAL | 2 refills | Status: AC

## 2024-04-14 MED ORDER — METFORMIN HCL 500 MG PO TABS: 500.0000 mg | ORAL_TABLET | Freq: Every day | ORAL | 2 refills | Status: AC

## 2024-04-14 MED ORDER — OLMESARTAN MEDOXOMIL 5 MG PO TABS: ORAL_TABLET | ORAL | 1 refills | Status: AC

## 2024-04-14 NOTE — Progress Notes (Signed)
 Subjective:    Patient ID: Shelley Wood, female    DOB: Apr 03, 1956, 68 y.o.   MRN: [id number]  Chief Complaint  Patient presents with   Medical Management of Chronic Issues    Patient has some nasal congestion.    Pt presents to the office today for chronic follow up. She has autism and development delay and  lives with her niece. She is doing a day program 5 days a day and every evening doing a 1:1 with field trip and activities.    She is followed by Cardiologists 6 months for SVT and A Fib. She is taking Eliquis 5 mg BID.  Hypertension This is a chronic problem. The current episode started more than 1 year ago. The problem has been resolved since onset. The problem is controlled. Pertinent negatives include no blurred vision, malaise/fatigue, peripheral edema or shortness of breath. Risk factors for coronary artery disease include dyslipidemia, diabetes mellitus and sedentary lifestyle. The current treatment provides moderate improvement.  Diabetes She presents for her follow-up diabetic visit. She has type 2 diabetes mellitus. Pertinent negatives for diabetes include no blurred vision and no foot paresthesias. Risk factors for coronary artery disease include diabetes mellitus, dyslipidemia, hypertension, sedentary lifestyle and post-menopausal. She is following a generally healthy diet. Her overall blood glucose range is 110-130 mg/dl. Eye exam is current.  Hyperlipidemia This is a chronic problem. The current episode started more than 1 year ago. The problem is controlled. Recent lipid tests were reviewed and are normal. Pertinent negatives include no shortness of breath. Current antihyperlipidemic treatment includes statins and exercise. The current treatment provides moderate improvement of lipids. Risk factors for coronary artery disease include dyslipidemia, hypertension, a sedentary lifestyle and post-menopausal.      Review of Systems  Constitutional:  Negative for  malaise/fatigue.  Eyes:  Negative for blurred vision.  Respiratory:  Negative for shortness of breath.   All other systems reviewed and are negative.  Family History  Problem Relation Age of Onset   Arthritis Mother    Heart disease Mother    Hypertension Mother    Stroke Mother    Alcohol abuse Father    Arthritis Father    Early death Father    Heart disease Father    Arthritis Sister    Birth defects Sister    Cancer Sister    COPD Sister    Hyperlipidemia Sister    Hypertension Sister    Arthritis Brother    Hyperlipidemia Brother    Hypertension Brother    Arthritis Maternal Grandmother    Arthritis Maternal Grandfather    Arthritis Brother    Arthritis Sister    Cancer Sister    Arthritis Sister    Learning disabilities Sister    Mental retardation Sister    Arthritis Sister    Arthritis Sister    Social History   Socioeconomic History   Marital status: Single    Spouse name: Not on file   Number of children: Not on file   Years of education: Not on file   Highest education level: Not on file  Occupational History   Occupation: Disabled  Tobacco Use   Smoking status: Never   Smokeless tobacco: Never  Vaping Use   Vaping status: Never Used  Substance and Sexual Activity   Alcohol use: No   Drug use: No   Sexual activity: Never  Other Topics Concern   Not on file  Social History Narrative   She is mentally challenged;  Lives with her sister, has a caretaker weekdays 8-5   Social Drivers of Health   Financial Resource Strain: Low Risk  (01/01/2023)   Overall Financial Resource Strain (CARDIA)    Difficulty of Paying Living Expenses: Not hard at all  Food Insecurity: No Food Insecurity (01/01/2023)   Hunger Vital Sign    Worried About Running Out of Food in the Last Year: Never true    Ran Out of Food in the Last Year: Never true  Transportation Needs: No Transportation Needs (01/01/2023)   PRAPARE - Administrator, Civil Service  (Medical): No    Lack of Transportation (Non-Medical): No  Physical Activity: Insufficiently Active (01/01/2023)   Exercise Vital Sign    Days of Exercise per Week: 3 days    Minutes of Exercise per Session: 30 min  Stress: No Stress Concern Present (01/01/2023)   [employer] of Occupational Health - Occupational Stress Questionnaire    Feeling of Stress : Not at all  Social Connections: Moderately Integrated (01/01/2023)   Social Connection and Isolation Panel    Frequency of Communication with Friends and Family: More than three times a week    Frequency of Social Gatherings with Friends and Family: More than three times a week    Attends Religious Services: More than 4 times per year    Active Member of [employer] or Organizations: Yes    Attends Engineer, structural: More than 4 times per year    Marital Status: Never married       Objective:   Physical Exam Vitals reviewed.  Constitutional:      General: She is not in acute distress.    Appearance: She is well-developed.  HENT:     Head: Normocephalic and atraumatic.     Right Ear: There is impacted cerumen.     Left Ear: Tympanic membrane normal.  Eyes:     Pupils: Pupils are equal, round, and reactive to light.  Neck:     Thyroid : No thyromegaly.  Cardiovascular:     Rate and Rhythm: Normal rate and regular rhythm.     Heart sounds: Normal heart sounds. No murmur heard. Pulmonary:     Effort: Pulmonary effort is normal. No respiratory distress.     Breath sounds: Normal breath sounds. No wheezing.  Abdominal:     General: Bowel sounds are normal. There is no distension.     Palpations: Abdomen is soft.     Tenderness: There is no abdominal tenderness.  Musculoskeletal:        General: No tenderness. Normal range of motion.     Cervical back: Normal range of motion and neck supple.  Skin:    General: Skin is warm and dry.  Neurological:     Mental Status: She is alert and oriented to person, place, and  time.     Cranial Nerves: No cranial nerve deficit.     Deep Tendon Reflexes: Reflexes are normal and symmetric.  Psychiatric:        Behavior: Behavior normal.        Thought Content: Thought content normal.         BP 135/67   Pulse 71   Temp 97.8 F (36.6 C) (Temporal)   Wt 106 lb 6.4 oz (48.3 kg)   SpO2 97%   BMI 24.73 kg/m   Assessment & Plan:  Jemmie Ledgerwood comes in today with chief complaint of Medical Management of Chronic Issues (Patient has some nasal congestion. )  Diagnosis and orders addressed:  1. Type 2 diabetes mellitus with other specified complication, without long-term current use of insulin (HCC) (Primary) - Bayer DCA Hb A1c Waived - CBC with Differential/Platelet - CMP14+EGFR - Microalbumin / creatinine urine ratio - empagliflozin  (JARDIANCE ) 10 MG TABS tablet; Take 1 tablet (10 mg total) by mouth daily.  Dispense: 90 tablet; Refill: 0 - metFORMIN  (GLUCOPHAGE ) 500 MG tablet; Take 1 tablet (500 mg total) by mouth daily with breakfast. (Needs to be seen before next refill)  Dispense: 90 tablet; Refill: 2 - olmesartan  (BENICAR ) 5 MG tablet; TAKE 1 TABLET BY MOUTH EVERY DAY  Dispense: 90 tablet; Refill: 1  2. Hypertension associated with diabetes (HCC) - CBC with Differential/Platelet - CMP14+EGFR - olmesartan  (BENICAR ) 5 MG tablet; TAKE 1 TABLET BY MOUTH EVERY DAY  Dispense: 90 tablet; Refill: 1  3. Gastroesophageal reflux disease without esophagitis - CBC with Differential/Platelet - CMP14+EGFR - omeprazole  (PRILOSEC) 20 MG capsule; TAKE 1 CAPSULE(20 MG) BY MOUTH DAILY  Dispense: 90 capsule; Refill: 0  4. Hyperlipidemia, unspecified hyperlipidemia type - CBC with Differential/Platelet - CMP14+EGFR - pravastatin  (PRAVACHOL ) 40 MG tablet; TAKE 1 TABLET (40 MG) BY MOUTH DAILY  Dispense: 90 tablet; Refill: 2  5. Autism - CBC with Differential/Platelet - CMP14+EGFR  6. Development delay - CBC with Differential/Platelet - CMP14+EGFR  7.  Post-menopause - CBC with Differential/Platelet - CMP14+EGFR  8. Paroxysmal atrial fibrillation [facility]) Keep Cardiologists     Labs pending Keep follow up Cardiologists  Continue current medications  Health Maintenance reviewed Diet and exercise encouraged  Follow up plan: 6 months    Bari Learn, FNP

## 2024-04-14 NOTE — Patient Instructions (Signed)
 Health Maintenance After Age 66 After age 27, you are at a higher risk for certain long-term diseases and infections as well as injuries from falls. Falls are a major cause of broken bones and head injuries in people who are older than age 73. Getting regular preventive care can help to keep you healthy and well. Preventive care includes getting regular testing and making lifestyle changes as recommended by your health care provider. Talk with your health care provider about: Which screenings and tests you should have. A screening is a test that checks for a disease when you have no symptoms. A diet and exercise plan that is right for you. What should I know about screenings and tests to prevent falls? Screening and testing are the best ways to find a health problem early. Early diagnosis and treatment give you the best chance of managing medical conditions that are common after age 90. Certain conditions and lifestyle choices may make you more likely to have a fall. Your health care provider may recommend: Regular vision checks. Poor vision and conditions such as cataracts can make you more likely to have a fall. If you wear glasses, make sure to get your prescription updated if your vision changes. Medicine review. Work with your health care provider to regularly review all of the medicines you are taking, including over-the-counter medicines. Ask your health care provider about any side effects that may make you more likely to have a fall. Tell your health care provider if any medicines that you take make you feel dizzy or sleepy. Strength and balance checks. Your health care provider may recommend certain tests to check your strength and balance while standing, walking, or changing positions. Foot health exam. Foot pain and numbness, as well as not wearing proper footwear, can make you more likely to have a fall. Screenings, including: Osteoporosis screening. Osteoporosis is a condition that causes  the bones to get weaker and break more easily. Blood pressure screening. Blood pressure changes and medicines to control blood pressure can make you feel dizzy. Depression screening. You may be more likely to have a fall if you have a fear of falling, feel depressed, or feel unable to do activities that you used to do. Alcohol  use screening. Using too much alcohol  can affect your balance and may make you more likely to have a fall. Follow these instructions at home: Lifestyle Do not drink alcohol  if: Your health care provider tells you not to drink. If you drink alcohol : Limit how much you have to: 0-1 drink a day for women. 0-2 drinks a day for men. Know how much alcohol  is in your drink. In the [place] one drink equals one 12 oz bottle of beer (355 mL), one 5 oz glass of wine (148 mL), or one 1 oz glass of hard liquor (44 mL). Do not use any products that contain nicotine or tobacco. These products include cigarettes, chewing tobacco, and vaping devices, such as e-cigarettes. If you need help quitting, ask your health care provider. Activity  Follow a regular exercise program to stay fit. This will help you maintain your balance. Ask your health care provider what types of exercise are appropriate for you. If you need a cane or walker, use it as recommended by your health care provider. Wear supportive shoes that have nonskid soles. Safety  Remove any tripping hazards, such as rugs, cords, and clutter. Install safety equipment such as grab bars in bathrooms and safety rails on stairs. Keep rooms and walkways  well-lit. General instructions Talk with your health care provider about your risks for falling. Tell your health care provider if: You fall. Be sure to tell your health care provider about all falls, even ones that seem minor. You feel dizzy, tiredness (fatigue), or off-balance. Take over-the-counter and prescription medicines only as told by your health care provider. These include  supplements. Eat a healthy diet and maintain a healthy weight. A healthy diet includes low-fat dairy products, low-fat (lean) meats, and fiber from whole grains, beans, and lots of fruits and vegetables. Stay current with your vaccines. Schedule regular health, dental, and eye exams. Summary Having a healthy lifestyle and getting preventive care can help to protect your health and wellness after age 15. Screening and testing are the best way to find a health problem early and help you avoid having a fall. Early diagnosis and treatment give you the best chance for managing medical conditions that are more common for people who are older than age 42. Falls are a major cause of broken bones and head injuries in people who are older than age 64. Take precautions to prevent a fall at home. Work with your health care provider to learn what changes you can make to improve your health and wellness and to prevent falls. This information is not intended to replace advice given to you by your health care provider. Make sure you discuss any questions you have with your health care provider. Document Revised: 11/19/2020 Document Reviewed: 11/19/2020 Elsevier Patient Education  2024 [employer].

## 2024-04-15 DIAGNOSIS — M85852 Other specified disorders of bone density and structure, left thigh: Secondary | ICD-10-CM | POA: Diagnosis not present

## 2024-04-15 DIAGNOSIS — Z780 Asymptomatic menopausal state: Secondary | ICD-10-CM | POA: Diagnosis not present

## 2024-04-15 LAB — MICROALBUMIN / CREATININE URINE RATIO
Creatinine, Urine: 56.9 mg/dL
Microalb/Creat Ratio: 8 mg/g{creat} (ref 0–29)
Microalbumin, Urine: 4.5 ug/mL

## 2024-04-18 ENCOUNTER — Ambulatory Visit: Payer: Self-pay | Admitting: Family

## 2024-04-18 ENCOUNTER — Other Ambulatory Visit: Payer: Self-pay | Admitting: Family

## 2024-04-18 DIAGNOSIS — M8580 Other specified disorders of bone density and structure, unspecified site: Secondary | ICD-10-CM | POA: Insufficient documentation

## 2024-04-26 ENCOUNTER — Ambulatory Visit (INDEPENDENT_AMBULATORY_CARE_PROVIDER_SITE_OTHER)

## 2024-04-26 VITALS — BP 135/67 | HR 71 | Ht <= 58 in | Wt 106.0 lb

## 2024-04-26 DIAGNOSIS — Z0000 Encounter for general adult medical examination without abnormal findings: Secondary | ICD-10-CM | POA: Diagnosis not present

## 2024-04-26 NOTE — Progress Notes (Signed)
 Subjective:   Shelley Wood is a 68 y.o. who presents for a Medicare Wellness preventive visit.  As a reminder, Annual Wellness Visits don't include a physical exam, and some assessments may be limited, especially if this visit is performed virtually. We may recommend an in-person follow-up visit with your provider if needed.  Visit Complete: Virtual I connected with  Shelley Wood on 04/26/24 by a audio enabled telemedicine application and verified that I am speaking with the correct person using two identifiers.  Patient Location: Home  Provider Location: Home Office  I discussed the limitations of evaluation and management by telemedicine. The patient expressed understanding and agreed to proceed.  Vital Signs: Because this visit was a virtual/telehealth visit, some criteria may be missing or patient reported. Any vitals not documented were not able to be obtained and vitals that have been documented are patient reported.  VideoDeclined- This patient declined Librarian, academic. Therefore the visit was completed with audio only.  Persons Participating in Visit: Patient.  AWV Questionnaire: No: Patient Medicare AWV questionnaire was not completed prior to this visit.  Cardiac Risk Factors include: advanced age (>36men, >59 women);diabetes mellitus;dyslipidemia;hypertension     Objective:    Today's Vitals   04/26/24 0804  BP: 135/67  Pulse: 71  Weight: 106 lb (48.1 kg)  Height: 4' 7 (1.397 m)   Body mass index is 24.64 kg/m.     04/26/2024    8:10 AM 01/01/2023    3:33 PM 12/30/2021    3:42 PM 12/07/2020    1:34 PM 12/31/2018    3:50 PM  Advanced Directives  Does Patient Have a Medical Advance Directive? No No No Yes No  Type of Primary school teacher of Healthcare Power of Attorney in Chart?    No - copy requested   Would patient like information on creating a medical advance directive?  No - Patient  declined No - Patient declined  Yes (MAU/Ambulatory/Procedural Areas - Information given)      Data saved with a previous flowsheet row definition    Current Medications (verified) Outpatient Encounter Medications as of 04/26/2024  Medication Sig   Accu-Chek Softclix Lancets lancets Check BS up to 4 times daily Dx E11.9   blood glucose meter kit and supplies Dispense based on patient and insurance preference. Use up to four times daily as directed. (FOR ICD-10 E10.9, E11.9).   Blood Glucose Monitoring Suppl (ACCU-CHEK GUIDE ME) w/Device KIT Check BS up to 4 times daily Dx E11.9   Blood Pressure Monitoring (BLOOD PRESSURE KIT) DEVI 1 Device by Does not apply route daily.   cetirizine (ZYRTEC ALLERGY) 10 MG tablet Take 1 tablet (10 mg total) by mouth daily.   ELIQUIS 5 MG TABS tablet Take 5 mg by mouth 2 (two) times daily.   empagliflozin  (JARDIANCE ) 10 MG TABS tablet Take 1 tablet (10 mg total) by mouth daily.   glucose blood (ACCU-CHEK GUIDE TEST) test strip Check BS up to 4 times daily Dx E11.9   metFORMIN  (GLUCOPHAGE ) 500 MG tablet Take 1 tablet (500 mg total) by mouth daily with breakfast. (Needs to be seen before next refill)   metoprolol  succinate (TOPROL -XL) 25 MG 24 hr tablet Take 25 mg by mouth.   olmesartan  (BENICAR ) 5 MG tablet TAKE 1 TABLET BY MOUTH EVERY DAY   omeprazole  (PRILOSEC) 20 MG capsule TAKE 1 CAPSULE(20 MG) BY MOUTH DAILY   pravastatin  (PRAVACHOL ) 40 MG tablet TAKE  1 TABLET (40 MG) BY MOUTH DAILY   No facility-administered encounter medications on file as of 04/26/2024.    Allergies (verified) Patient has no known allergies.   History: Past Medical History:  Diagnosis Date   Allergy    Cataract    Depression    Diabetes mellitus without complication (HCC)    Emphysema of lung (HCC)    GERD (gastroesophageal reflux disease)    Heart murmur    Hyperlipidemia    Hypertension    Past Surgical History:  Procedure Laterality Date   ABDOMINAL HYSTERECTOMY      RADICAL HYSTERECTOMY     Family History  Problem Relation Age of Onset   Arthritis Mother    Heart disease Mother    Hypertension Mother    Stroke Mother    Alcohol abuse Father    Arthritis Father    Early death Father    Heart disease Father    Arthritis Sister    Birth defects Sister    Cancer Sister    COPD Sister    Hyperlipidemia Sister    Hypertension Sister    Arthritis Brother    Hyperlipidemia Brother    Hypertension Brother    Arthritis Maternal Grandmother    Arthritis Maternal Grandfather    Arthritis Brother    Arthritis Sister    Cancer Sister    Arthritis Sister    Learning disabilities Sister    Mental retardation Sister    Arthritis Sister    Arthritis Sister    Social History   Socioeconomic History   Marital status: Single    Spouse name: Not on file   Number of children: Not on file   Years of education: Not on file   Highest education level: Not on file  Occupational History   Occupation: Disabled  Tobacco Use   Smoking status: Never   Smokeless tobacco: Never  Vaping Use   Vaping status: Never Used  Substance and Sexual Activity   Alcohol use: No   Drug use: No   Sexual activity: Never  Other Topics Concern   Not on file  Social History Narrative   She is mentally challenged; Lives with her sister, has a caretaker weekdays 8-5   Social Drivers of Health   Financial Resource Strain: Low Risk  (04/26/2024)   Overall Financial Resource Strain (CARDIA)    Difficulty of Paying Living Expenses: Not hard at all  Food Insecurity: No Food Insecurity (04/26/2024)   Hunger Vital Sign    Worried About Running Out of Food in the Last Year: Never true    Ran Out of Food in the Last Year: Never true  Transportation Needs: No Transportation Needs (04/26/2024)   PRAPARE - Administrator, Civil Service (Medical): No    Lack of Transportation (Non-Medical): No  Physical Activity: Insufficiently Active (04/26/2024)   Exercise  Vital Sign    Days of Exercise per Week: 3 days    Minutes of Exercise per Session: 30 min  Stress: No Stress Concern Present (04/26/2024)   [employer] of Occupational Health - Occupational Stress Questionnaire    Feeling of Stress: Not at all  Social Connections: Moderately Integrated (04/26/2024)   Social Connection and Isolation Panel    Frequency of Communication with Friends and Family: More than three times a week    Frequency of Social Gatherings with Friends and Family: More than three times a week    Attends Religious Services: More than 4 times per  year    Active Member of Clubs or Organizations: Yes    Attends Banker Meetings: More than 4 times per year    Marital Status: Never married    Tobacco Counseling Counseling given: Yes    Clinical Intake:  Pre-visit preparation completed: Yes  Pain : No/denies pain     BMI - recorded: 24.64 Nutritional Status: BMI of 19-24  Normal Nutritional Risks: None Diabetes: Yes  Lab Results  Component Value Date   HGBA1C 6.2 (H) 04/14/2024   HGBA1C 5.9 (H) 10/09/2023   HGBA1C 6.0 (H) 04/09/2023     How often do you need to have someone help you when you read instructions, pamphlets, or other written materials from your doctor or pharmacy?: 1 - Never  Interpreter Needed?: No  Information entered by :: alia t/cma   Activities of Daily Living     04/26/2024    8:08 AM  In your present state of health, do you have any difficulty performing the following activities:  Hearing? 0  Vision? 0  Difficulty concentrating or making decisions? 0  Walking or climbing stairs? 0  Dressing or bathing? 0  Doing errands, shopping? 1  Comment pt's niece-Kendra [employer] and eating ? N  Using the Toilet? N  In the past six months, have you accidently leaked urine? N  Do you have problems with loss of bowel control? N  Managing your Medications? Y  Comment pt's niece-Kendra [employer] your  Finances? Y  Comment pt's niece-Kendra [employer] or managing your Housekeeping? N    Patient Care Team: Lavell Bari LABOR, FNP as PCP - General (Family Medicine) Ladora Ross Lacy Phebe, MD as Referring Physician (Optometry) Roddie Bring, DPM as Consulting Physician (Podiatry)  I have updated your Care Teams any recent Medical Services you may have received from other providers in the past year.     Assessment:   This is a routine wellness examination for Shelley Wood.  Hearing/Vision screen Hearing Screening - Comments:: Pt denies hearing dif Vision Screening - Comments:: Pt wear glasses/pt goes to [facility] in [place]/last ov 10/24   Goals Addressed             This Visit's Progress    Exercise 3x per week (30 min per time)   On track    Continue weight watchers and exercise program       Depression Screen     04/26/2024    8:11 AM 04/14/2024    8:22 AM 10/09/2023    8:31 AM 01/01/2023    3:32 PM 10/07/2022    8:41 AM 10/07/2022    8:36 AM 12/30/2021    3:53 PM  PHQ 2/9 Scores  PHQ - 2 Score 0 0 0 0 0 0 0  PHQ- 9 Score 0 0 0  0 0     Fall Risk     04/26/2024    8:06 AM 04/14/2024    8:22 AM 04/14/2024    8:13 AM 01/01/2023    3:31 PM 10/07/2022    8:36 AM  Fall Risk   Falls in the past year? 0 0 0 0 0  Number falls in past yr: 0  0 0 0  Injury with Fall? 0  0 0 0  Risk for fall due to : No Fall Risks  No Fall Risks No Fall Risks No Fall Risks  Follow up Falls evaluation completed  Falls evaluation completed Falls prevention discussed  Education provided    MEDICARE RISK AT HOME:  Medicare Risk at Home Any stairs in or around the home?: Yes If so, are there any without handrails?: Yes Home free of loose throw rugs in walkways, pet beds, electrical cords, etc?: Yes Adequate lighting in your home to reduce risk of falls?: Yes Life alert?: No Use of a cane, walker or w/c?: No Grab bars in the bathroom?: No Shower chair or bench in shower?:  No Elevated toilet seat or a handicapped toilet?: No  TIMED UP AND GO:  Was the test performed?  no  Cognitive Function: Impaired: Patient has current diagnosis of cognitive impairment.        04/26/2024    8:13 AM 01/01/2023    3:33 PM 12/30/2021    3:46 PM 12/31/2018    3:52 PM  6CIT Screen  What Year? 0 points 0 points 4 points 0 points  What month? 0 points 0 points 0 points 0 points  What time? 0 points 0 points 0 points 0 points  Count back from 20 0 points 0 points 4 points 2 points  Months in reverse 0 points 0 points 4 points 4 points  Repeat phrase 0 points 0 points 2 points 4 points  Total Score 0 points 0 points 14 points 10 points    Immunizations Immunization History  Administered Date(s) Administered   Influenza,inj,Quad PF,6+ Mos 06/09/2017, 06/15/2018, 09/15/2019, 08/06/2021   Moderna SARS-COV2 Booster Vaccination 03/12/2021   Moderna Sars-Covid-2 Vaccination 09/28/2019, 10/26/2019, 06/19/2020   Pneumococcal Polysaccharide-23 09/28/2020   Tdap 09/28/2020    Screening Tests Health Maintenance  Topic Date Due   OPHTHALMOLOGY EXAM  Never done   Zoster Vaccines- Shingrix (1 of 2) Never done   Pneumococcal Vaccine: 50+ Years (2 of 2 - PCV) 09/28/2021   COVID-19 Vaccine (5 - 2025-26 season) 03/14/2024   Influenza Vaccine  10/11/2024 (Originally 02/12/2024)   Mammogram  09/05/2024   HEMOGLOBIN A1C  10/13/2024   FOOT EXAM  11/04/2024   Diabetic kidney evaluation - eGFR measurement  04/14/2025   Diabetic kidney evaluation - Urine ACR  04/14/2025   Fecal DNA (Cologuard)  04/18/2025   Medicare Annual Wellness (AWV)  04/26/2025   DEXA SCAN  04/15/2026   DTaP/Tdap/Td (2 - Td or Tdap) 09/29/2030   Hepatitis C Screening  Completed   Meningococcal B Vaccine  Aged Out    Health Maintenance Items Addressed: See Nurse Notes at the end of this note  Additional Screening:  Vision Screening: Recommended annual ophthalmology exams for early detection of glaucoma and  other disorders of the eye. Is the patient up to date with their annual eye exam? No Who is the provider or what is the name of the office in which the patient attends annual eye exams? [facility]  Dental Screening: Recommended annual dental exams for proper oral hygiene  Community Resource Referral / Chronic Care Management: CRR required this visit?  No   CCM required this visit?  No   Plan:    I have personally reviewed and noted the following in the patient's chart:   Medical and social history Use of alcohol, tobacco or illicit drugs  Current medications and supplements including opioid prescriptions. Patient is not currently taking opioid prescriptions. Functional ability and status Nutritional status Physical activity Advanced directives List of other physicians Hospitalizations, surgeries, and ER visits in previous 12 months Vitals Screenings to include cognitive, depression, and falls Referrals and appointments  In addition, I  have reviewed and discussed with patient certain preventive protocols, quality metrics, and best practice recommendations. A written personalized care plan for preventive services as well as general preventive health recommendations were provided to patient.   Shelley Wood, CMA   04/26/2024   After Visit Summary: (MyChart) Due to this being a telephonic visit, the after visit summary with patients personalized plan was offered to patient via MyChart   Notes: PCP Follow Up Recommendations: pt is aware and due for diabetic eye exam--per pt will make an appt, covid, shingles, pneumonia vaccines, will get it done at next ov

## 2024-04-26 NOTE — Patient Instructions (Signed)
 Shelley Wood,  Thank you for taking the time for your Medicare Wellness Visit. I appreciate your continued commitment to your health goals. Please review the care plan we discussed, and feel free to reach out if I can assist you further.  Medicare recommends these wellness visits once per year to help you and your care team stay ahead of potential health issues. These visits are designed to focus on prevention, allowing your provider to concentrate on managing your acute and chronic conditions during your regular appointments.  Please note that Annual Wellness Visits do not include a physical exam. Some assessments may be limited, especially if the visit was conducted virtually. If needed, we may recommend a separate in-person follow-up with your provider.  Ongoing Care Seeing your primary care provider every 3 to 6 months helps us  monitor your health and provide consistent, personalized care.   Referrals If a referral was made during today's visit and you haven't received any updates within two weeks, please contact the referred provider directly to check on the status.  Recommended Screenings:  Health Maintenance  Topic Date Due   Eye exam for diabetics  Never done   Zoster (Shingles) Vaccine (1 of 2) Never done   Pneumococcal Vaccine for age over 63 (2 of 2 - PCV) 09/28/2021   Medicare Annual Wellness Visit  01/01/2024   COVID-19 Vaccine (5 - 2025-26 season) 03/14/2024   Flu Shot  10/11/2024*   Breast Cancer Screening  09/05/2024   Hemoglobin A1C  10/13/2024   Complete foot exam   11/04/2024   Yearly kidney function blood test for diabetes  04/14/2025   Yearly kidney health urinalysis for diabetes  04/14/2025   Cologuard (Stool DNA test)  04/18/2025   DEXA scan (bone density measurement)  04/15/2026   DTaP/Tdap/Td vaccine (2 - Td or Tdap) 09/29/2030   Hepatitis C Screening  Completed   Meningitis B Vaccine  Aged Out  *Topic was postponed. The date shown is not the original due date.        04/26/2024    8:10 AM  Advanced Directives  Does Patient Have a Medical Advance Directive? No   Advance Care Planning is important because it: Ensures you receive medical care that aligns with your values, goals, and preferences. Provides guidance to your family and loved ones, reducing the emotional burden of decision-making during critical moments.  Vision: Annual vision screenings are recommended for early detection of glaucoma, cataracts, and diabetic retinopathy. These exams can also reveal signs of chronic conditions such as diabetes and high blood pressure.  Dental: Annual dental screenings help detect early signs of oral cancer, gum disease, and other conditions linked to overall health, including heart disease and diabetes.  Please see the attached documents for additional preventive care recommendations.

## 2024-05-10 NOTE — Progress Notes (Signed)
 Shelley Wood                                          MRN: [id number]   05/10/2024   The [facility] Quality Team Specialist reviewed this patient medical record for the purposes of chart review for care gap closure. The following were reviewed: abstraction for care gap closure-glycemic status assessment.    [facility]I Quality Team

## 2024-05-13 ENCOUNTER — Other Ambulatory Visit: Payer: Self-pay | Admitting: *Deleted

## 2024-05-13 DIAGNOSIS — E1169 Type 2 diabetes mellitus with other specified complication: Secondary | ICD-10-CM

## 2024-05-15 ENCOUNTER — Ambulatory Visit
Admission: RE | Admit: 2024-05-15 | Discharge: 2024-05-15 | Disposition: A | Discharge status: Home / Self Care | Attending: Family

## 2024-05-15 VITALS — BP 134/76 | HR 66 | Temp 97.5°F | Resp 18

## 2024-05-15 DIAGNOSIS — Z711 Person with feared health complaint in whom no diagnosis is made: Secondary | ICD-10-CM

## 2024-05-15 NOTE — Discharge Instructions (Signed)
 Please follow up if any pain occurs.
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
## 2024-10-21 ENCOUNTER — Ambulatory Visit: Payer: Self-pay | Admitting: Family

## 2025-04-27 ENCOUNTER — Ambulatory Visit: Payer: Self-pay

## 2025-05-02 ENCOUNTER — Ambulatory Visit
